# Patient Record
Sex: Male | Born: 1985 | Race: White | Hispanic: No | Marital: Single | Smoking: Current some day smoker
Health system: Southern US, Community
[De-identification: ages and names within clinical notes are randomized; demographics above are authoritative.]

## PROBLEM LIST (undated history)

## (undated) DIAGNOSIS — M109 Gout, unspecified: Secondary | ICD-10-CM

## (undated) DIAGNOSIS — F4325 Adjustment disorder with mixed disturbance of emotions and conduct: Secondary | ICD-10-CM

## (undated) DIAGNOSIS — Z7901 Long term (current) use of anticoagulants: Secondary | ICD-10-CM

## (undated) DIAGNOSIS — I10 Essential (primary) hypertension: Secondary | ICD-10-CM

## (undated) DIAGNOSIS — M1990 Unspecified osteoarthritis, unspecified site: Secondary | ICD-10-CM

## (undated) DIAGNOSIS — I82409 Acute embolism and thrombosis of unspecified deep veins of unspecified lower extremity: Secondary | ICD-10-CM

## (undated) DIAGNOSIS — I272 Pulmonary hypertension, unspecified: Secondary | ICD-10-CM

## (undated) HISTORY — DX: Essential (primary) hypertension: I10

## (undated) HISTORY — DX: Long term (current) use of anticoagulants: Z79.01

## (undated) HISTORY — DX: Adjustment disorder with mixed disturbance of emotions and conduct: F43.25

## (undated) HISTORY — DX: Gout, unspecified: M10.9

## (undated) HISTORY — PX: OTHER SURGICAL HISTORY: SHX169

---

## 2006-06-15 ENCOUNTER — Emergency Department: Payer: Self-pay | Admitting: Emergency Medicine

## 2014-04-25 DIAGNOSIS — Z86718 Personal history of other venous thrombosis and embolism: Secondary | ICD-10-CM | POA: Insufficient documentation

## 2014-06-30 ENCOUNTER — Ambulatory Visit: Payer: Self-pay | Admitting: Sports Medicine

## 2014-08-18 ENCOUNTER — Ambulatory Visit (INDEPENDENT_AMBULATORY_CARE_PROVIDER_SITE_OTHER): Payer: BLUE CROSS/BLUE SHIELD | Admitting: Sports Medicine

## 2014-08-18 ENCOUNTER — Encounter: Payer: Self-pay | Admitting: Sports Medicine

## 2014-08-18 VITALS — BP 144/92 | HR 88 | Ht 70.0 in | Wt 250.0 lb

## 2014-08-18 DIAGNOSIS — R269 Unspecified abnormalities of gait and mobility: Secondary | ICD-10-CM

## 2014-08-18 DIAGNOSIS — L84 Corns and callosities: Secondary | ICD-10-CM | POA: Diagnosis not present

## 2014-08-18 DIAGNOSIS — M2010 Hallux valgus (acquired), unspecified foot: Secondary | ICD-10-CM | POA: Diagnosis not present

## 2014-08-18 DIAGNOSIS — I2699 Other pulmonary embolism without acute cor pulmonale: Secondary | ICD-10-CM | POA: Insufficient documentation

## 2014-08-18 DIAGNOSIS — Q6689 Other  specified congenital deformities of feet: Secondary | ICD-10-CM | POA: Diagnosis not present

## 2014-08-18 NOTE — Progress Notes (Signed)
Jobin UkraineSantiago - 29 y.o. male MRN [id number]  Date of birth: March 12, 1986  SUBJECTIVE: CC: Bilateral foot pain HPI: 29 year old male presenting with long-standing bilateral foot pain worse over the balls of his feet. This is progressively worsened over the past several months. 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 Standing and walking long periods is more bothersome over the balls of his feet. Denies any numbness, tingling ankle weakness. He has tried tramadol without significant improvement. He has been seen by foot and ankle specialist as recommended surgical corrections however he would like to defer this at this time. He has not tried any custom orthotics. He has been using fatigue reducing shoes with rubber insoles. These do seem to help him slightly. Denies any significant back pain.  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  HISTORY:  Past Medical, Surgical, Social, and Family History reviewed & updated per EMR.  Pertinent Historical Findings include:  reports that he has been smoking.  He does not have any smokeless tobacco history on file. PE, family history of foot disorder.   OBJECTIVE:  VS:   HT:5\' 10"  (177.8 cm)   WT:250 lb (113.399 kg)  BMI:35.9          BP:(!) 144/92 mmHg  HR:88bpm  TEMP: ( )  RESP:   PHYSICAL EXAM:  Adult well-built male in no acute distress. Alert and appropriately interactive. Good insight. Lower extremities overall normal alignment. No significant pretibial edema. DP and PT pulses 2+/4. Bilateral Foot & Ankle Exam: Appearance: Forefoot alignment: metatarsus adductus with significant bunion formation, right worse than left. He has second and fourth subluxation of the MTP on the right. Hindfoot alignment:  neutral Longitudinal Arch: moderate Transverse Arch: collapses with weight bearing  Skin: No overlying erythema/ecchymosis. Marked and painful Morton's callus on left foot; diffuse callus on right foot  Palpation: TTP over: Left Morton's callus, metatarsal heads  No TTP over: PT, navicular, midfoot Metatarsal Squeeze Test: Negative   Strength, ROM & OtherTests: Ankle Dorsiflexion: Right = 100; Left = 100 Great toe motion: Right = hallux rigidus; Left = hallux rigidus Repeated Heel Raise: Normal, good posterior tibialis recruitment. 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    ASSESSMENT: 1. Abnormal gait   2. Plantar callus   3. Congenitally short metatarsal   4. Bunion, unspecified laterality    No problems updated.  PROCEDURES: CUSTOM ORTHOTICS The patient was fitted for a standard, cushioned, semi-rigid orthotic. The orthotic was heated & placed on the orthotic stand. The patient was positioned in subtalar neutral position and 10 of ankle dorsiflexion and weight bearing stance some heated orthotic blank. After completion of the molding a stable paste was applied to the orthotic blank. The orthotic was ground to a stable position for weightbearing. The patient ambulated in these and reported they were comfortable without pressure spots.              BLANK:  Size 13 - Standard Cushioned                 BASE:  Blue EVA      POSTINGS:  Small metatarsal pads >50% of this 45 minute visit was spent  in direct face to face evaluation, measurement and manufacture of custom molded orthotic.   PEARING OF CALLUS After informed consent was obtained. Using an 11 blade scalpel the callus on the right foot was pared down until no longer painful. Patient reported marked improvement in his symptoms following this. Discouraged him from performing this at home. Recommended below self treatment.  PLAN: See problem based  charting & AVS for additional documentation.  Custom orthotics today, to be worn on a daily basis.. Follow  Recommend using pumice stone and Carmol, lanolin ointments to soft and calluses. > Return if symptoms worsen or fail to improve.

## 2018-07-21 ENCOUNTER — Ambulatory Visit (HOSPITAL_BASED_OUTPATIENT_CLINIC_OR_DEPARTMENT_OTHER)
Admit: 2018-07-21 | Discharge: 2018-07-21 | Disposition: A | Discharge status: Home / Self Care | Payer: Self-pay | Attending: Emergency Medicine | Admitting: Emergency Medicine

## 2018-07-21 ENCOUNTER — Other Ambulatory Visit: Payer: Self-pay

## 2018-07-21 ENCOUNTER — Emergency Department (HOSPITAL_COMMUNITY): Payer: Self-pay

## 2018-07-21 ENCOUNTER — Observation Stay (HOSPITAL_COMMUNITY)
Admission: EM | Admit: 2018-07-21 | Discharge: 2018-07-22 | Disposition: A | Discharge status: Home / Self Care | Payer: Self-pay | Attending: Internal Medicine | Admitting: Internal Medicine

## 2018-07-21 ENCOUNTER — Encounter (HOSPITAL_COMMUNITY): Payer: Self-pay | Admitting: Emergency Medicine

## 2018-07-21 DIAGNOSIS — N200 Calculus of kidney: Secondary | ICD-10-CM | POA: Insufficient documentation

## 2018-07-21 DIAGNOSIS — I2699 Other pulmonary embolism without acute cor pulmonale: Secondary | ICD-10-CM | POA: Diagnosis present

## 2018-07-21 DIAGNOSIS — R918 Other nonspecific abnormal finding of lung field: Secondary | ICD-10-CM | POA: Insufficient documentation

## 2018-07-21 DIAGNOSIS — I82409 Acute embolism and thrombosis of unspecified deep veins of unspecified lower extremity: Secondary | ICD-10-CM | POA: Diagnosis present

## 2018-07-21 DIAGNOSIS — I10 Essential (primary) hypertension: Secondary | ICD-10-CM | POA: Diagnosis present

## 2018-07-21 DIAGNOSIS — I82402 Acute embolism and thrombosis of unspecified deep veins of left lower extremity: Secondary | ICD-10-CM | POA: Insufficient documentation

## 2018-07-21 DIAGNOSIS — M1990 Unspecified osteoarthritis, unspecified site: Secondary | ICD-10-CM | POA: Insufficient documentation

## 2018-07-21 DIAGNOSIS — F17210 Nicotine dependence, cigarettes, uncomplicated: Secondary | ICD-10-CM | POA: Insufficient documentation

## 2018-07-21 DIAGNOSIS — Z7901 Long term (current) use of anticoagulants: Secondary | ICD-10-CM | POA: Insufficient documentation

## 2018-07-21 DIAGNOSIS — N179 Acute kidney failure, unspecified: Secondary | ICD-10-CM | POA: Diagnosis present

## 2018-07-21 DIAGNOSIS — I272 Pulmonary hypertension, unspecified: Secondary | ICD-10-CM | POA: Insufficient documentation

## 2018-07-21 DIAGNOSIS — Z720 Tobacco use: Secondary | ICD-10-CM | POA: Diagnosis present

## 2018-07-21 DIAGNOSIS — M79605 Pain in left leg: Secondary | ICD-10-CM

## 2018-07-21 DIAGNOSIS — I2694 Multiple subsegmental pulmonary emboli without acute cor pulmonale: Principal | ICD-10-CM | POA: Insufficient documentation

## 2018-07-21 HISTORY — DX: Unspecified osteoarthritis, unspecified site: M19.90

## 2018-07-21 HISTORY — DX: Pulmonary hypertension, unspecified: I27.20

## 2018-07-21 HISTORY — DX: Acute embolism and thrombosis of unspecified deep veins of unspecified lower extremity: I82.409

## 2018-07-21 LAB — BASIC METABOLIC PANEL
Anion gap: 9 (ref 5–15)
BUN: 13 mg/dL (ref 6–20)
CO2: 26 mmol/L (ref 22–32)
Calcium: 9.6 mg/dL (ref 8.9–10.3)
Chloride: 104 mmol/L (ref 98–111)
Creatinine, Ser: 1.37 mg/dL — ABNORMAL HIGH (ref 0.61–1.24)
GFR calc Af Amer: >60 mL/min (ref >60)
GFR calc non Af Amer: >60 mL/min (ref >60)
GLUCOSE: 85 mg/dL (ref 70–99)
Potassium: 4 mmol/L (ref 3.5–5.1)
Sodium: 139 mmol/L (ref 135–145)

## 2018-07-21 LAB — CBC
HCT: 45.2 % (ref 39.0–52.0)
Hemoglobin: 15.4 g/dL (ref 13.0–17.0)
MCH: 31.5 pg (ref 26.0–34.0)
MCHC: 34.1 g/dL (ref 30.0–36.0)
MCV: 92.4 fL (ref 80.0–100.0)
Platelets: 236 10*3/uL (ref 150–400)
RBC: 4.89 MIL/uL (ref 4.22–5.81)
RDW: 12 % (ref 11.5–15.5)
WBC: 10.9 10*3/uL — ABNORMAL HIGH (ref 4.0–10.5)
nRBC: 0 % (ref 0.0–0.2)

## 2018-07-21 LAB — I-STAT TROPONIN, ED: Troponin i, poc: 0.01 ng/mL (ref 0.00–0.08)

## 2018-07-21 LAB — D-DIMER, QUANTITATIVE: D-Dimer, Quant: 4.8 ug/mL-FEU — ABNORMAL HIGH (ref 0.00–0.50)

## 2018-07-21 LAB — PROTIME-INR
INR: 1.07
Prothrombin Time: 13.8 seconds (ref 11.4–15.2)

## 2018-07-21 LAB — BRAIN NATRIURETIC PEPTIDE: B NATRIURETIC PEPTIDE 5: 5.2 pg/mL (ref 0.0–100.0)

## 2018-07-21 LAB — ANTITHROMBIN III: AntiThromb III Func: 86 % (ref 75–120)

## 2018-07-21 MED ORDER — SENNOSIDES-DOCUSATE SODIUM 8.6-50 MG PO TABS: 1.0000 | ORAL_TABLET | Freq: Every evening | ORAL | Status: DC | PRN

## 2018-07-21 MED ORDER — ONDANSETRON HCL 4 MG/2ML IJ SOLN: 4.0000 mg | Freq: Four times a day (QID) | INTRAMUSCULAR | Status: DC | PRN

## 2018-07-21 MED ORDER — ACETAMINOPHEN 650 MG RE SUPP: 650.0000 mg | Freq: Four times a day (QID) | RECTAL | Status: DC | PRN

## 2018-07-21 MED ORDER — ACETAMINOPHEN 325 MG PO TABS: 650.0000 mg | ORAL_TABLET | Freq: Four times a day (QID) | ORAL | Status: DC | PRN

## 2018-07-21 MED ORDER — SODIUM CHLORIDE 0.9 % IV BOLUS
1000.0000 mL | Freq: Once | INTRAVENOUS | Status: AC
  Administered 2018-07-21: 1000 mL via INTRAVENOUS

## 2018-07-21 MED ORDER — OXYCODONE-ACETAMINOPHEN 5-325 MG PO TABS: 1.0000 | ORAL_TABLET | ORAL | Status: DC | PRN

## 2018-07-21 MED ORDER — MORPHINE SULFATE (PF) 2 MG/ML IV SOLN: 2.0000 mg | INTRAVENOUS | Status: DC | PRN

## 2018-07-21 MED ORDER — IOPAMIDOL (ISOVUE-370) INJECTION 76%
INTRAVENOUS | Status: AC
  Administered 2018-07-21: 100 mL
  Filled 2018-07-21: qty 100

## 2018-07-21 MED ORDER — ZOLPIDEM TARTRATE 5 MG PO TABS: 5.0000 mg | ORAL_TABLET | Freq: Every evening | ORAL | Status: DC | PRN

## 2018-07-21 MED ORDER — SODIUM CHLORIDE 0.9 % IV SOLN
INTRAVENOUS | Status: DC
  Administered 2018-07-22 (×2): via INTRAVENOUS

## 2018-07-21 MED ORDER — NICOTINE 21 MG/24HR TD PT24: 21.0000 mg | MEDICATED_PATCH | Freq: Every day | TRANSDERMAL | Status: DC

## 2018-07-21 MED ORDER — HEPARIN BOLUS VIA INFUSION
5000.0000 [IU] | Freq: Once | INTRAVENOUS | Status: AC
  Administered 2018-07-21: 5000 [IU] via INTRAVENOUS
  Filled 2018-07-21: qty 5000

## 2018-07-21 MED ORDER — HEPARIN (PORCINE) 25000 UT/250ML-% IV SOLN
1550.0000 [IU]/h | INTRAVENOUS | Status: DC
  Administered 2018-07-21 – 2018-07-22 (×2): 1550 [IU]/h via INTRAVENOUS
  Filled 2018-07-21 (×2): qty 250

## 2018-07-21 MED ORDER — HYDRALAZINE HCL 20 MG/ML IJ SOLN: 5.0000 mg | INTRAMUSCULAR | Status: DC | PRN

## 2018-07-21 MED ORDER — ALBUTEROL SULFATE (2.5 MG/3ML) 0.083% IN NEBU: 2.5000 mg | INHALATION_SOLUTION | RESPIRATORY_TRACT | Status: DC | PRN

## 2018-07-21 MED ORDER — ONDANSETRON HCL 4 MG PO TABS: 4.0000 mg | ORAL_TABLET | Freq: Four times a day (QID) | ORAL | Status: DC | PRN

## 2018-07-21 MED ORDER — DM-GUAIFENESIN ER 30-600 MG PO TB12
1.0000 | ORAL_TABLET | Freq: Two times a day (BID) | ORAL | Status: DC | PRN
  Filled 2018-07-21: qty 1

## 2018-07-21 NOTE — Progress Notes (Signed)
Left lower extremity venous duplex has been completed. There is evidence of acute deep vein thrombosis involving the popliteal, and intramuscular gastrocnemius veins of the left lower extremity. Results were given to [facility] PA.  07/21/18 6:12 PM Olen Cordial RVT

## 2018-07-21 NOTE — ED Notes (Addendum)
Verified Heparin with Hannie RN

## 2018-07-21 NOTE — ED Provider Notes (Signed)
[facility] EMERGENCY DEPARTMENT Provider Note   CSN: [id number] Arrival date & time: 07/21/18  1619     History   Chief Complaint Chief Complaint  Patient presents with  . Chest Pain  . Leg Pain  . Shortness of Breath    HPI Randall Beasley is a 33 y.o. male.  HPI Randall Beasley is a 33 y.o. male with history of blood clots, presents to emergency department with complaint of left leg pain and chest pain.  Patient states that few days ago he is noticed pain in his left foot, left calf, and now left thigh.  He states that since yesterday the pain has moved into his left lung.  Pain is worsened with deep breathing.  He also reports cough that he has had for several days.  He denies any fever or chills.  No other URI symptoms.  He states his last known blood clot was in 2013, which time he was on blood thinners.  He states he currently does not have a primary care doctor and that is why he is not taking blood thinners.  He denies any recent surgeries.  He is a smoker.  He states that he did travel by car for 2-1/2 hours and back 1 week ago.  Past Medical History:  Diagnosis Date  . Arthritis   . DVT (deep venous thrombosis) (HCC)   . Hypertension   . Pulmonary HTN [facility])     Patient Active Problem List   Diagnosis Date Noted  . PE (pulmonary embolism) 08/18/2014  . H/O thrombosis 04/25/2014    History reviewed. No pertinent surgical history.      Home Medications    Prior to Admission medications   Medication Sig Start Date End Date Taking? Authorizing Provider  rivaroxaban (XARELTO) 20 MG TABS tablet Take 20 mg by mouth.    [provider]    Family History History reviewed. No pertinent family history.  Social History Social History   Tobacco Use  . Smoking status: Current Some Day Smoker    Packs/day: 1.00    Types: Cigarettes  . Smokeless tobacco: Former Engineer, water Use Topics  . Alcohol use: Not Currently    Alcohol/week:  0.0 standard drinks  . Drug use: Never     Allergies   Patient has no known allergies.   Review of Systems Review of Systems  Constitutional: Negative for chills and fever.  Respiratory: Positive for cough, chest tightness and shortness of breath.   Cardiovascular: Positive for chest pain.  Musculoskeletal: Positive for arthralgias and myalgias.  Skin: Negative for color change.  Neurological: Negative for weakness and numbness.  All other systems reviewed and are negative.    Physical Exam Updated Vital Signs BP (!) 142/91   Pulse 85   Resp (!) 21   SpO2 97%   Physical Exam Vitals signs and nursing note reviewed.  Constitutional:      General: He is not in acute distress.    Appearance: He is well-developed.  HENT:     Head: Normocephalic and atraumatic.  Eyes:     Conjunctiva/sclera: Conjunctivae normal.  Neck:     Musculoskeletal: Neck supple.  Cardiovascular:     Rate and Rhythm: Normal rate and regular rhythm.     Heart sounds: Normal heart sounds.  Pulmonary:     Effort: Pulmonary effort is normal. No respiratory distress.     Breath sounds: No wheezing or rales.  Abdominal:     General:  Bowel sounds are normal. There is no distension.     Palpations: Abdomen is soft.     Tenderness: There is no abdominal tenderness. There is no rebound.  Musculoskeletal:     Comments: Legs are equal in size.  Tenderness to palpation over left calf muscle.  Negative Homans sign.  Skin:    General: Skin is warm and dry.  Neurological:     Mental Status: He is alert.      ED Treatments / Results  Labs (all labs ordered are listed, but only abnormal results are displayed) Labs Reviewed  BASIC METABOLIC PANEL - Abnormal; Notable for the following components:      Result Value   Creatinine, Ser 1.37 (*)    All other components within normal limits  CBC - Abnormal; Notable for the following components:   WBC 10.9 (*)    All other components within normal limits    D-DIMER, QUANTITATIVE (NOT AT [facility]) - Abnormal; Notable for the following components:   D-Dimer, Quant 4.80 (*)    All other components within normal limits  PROTIME-INR  I-STAT TROPONIN, ED    EKG None  Radiology Dg Chest 2 View  Result Date: 07/21/2018 CLINICAL DATA:  33 y/o M; central chest pain, shortness of breath, left leg pain. History of DVT and PE. EXAM: CHEST - 2 VIEW COMPARISON:  07/24/2017 chest radiograph. FINDINGS: Stable heart size and mediastinal contours are within normal limits. Both lungs are clear. The visualized skeletal structures are unremarkable. IMPRESSION: No acute pulmonary process identified Electronically Signed   By: Mitzi Hansen M.D.   On: 07/21/2018 17:23    Procedures Procedures (including critical care time)  CRITICAL CARE Performed by: Kimimila Tauzin Total critical care time: 30 minutes Critical care time was exclusive of separately billable procedures and treating other patients. Critical care was necessary to treat or prevent imminent or life-threatening deterioration. Critical care was time spent personally by me on the following activities: development of treatment plan with patient and/or surrogate as well as nursing, discussions with consultants, evaluation of patient's response to treatment, examination of patient, obtaining history from patient or surrogate, ordering and performing treatments and interventions, ordering and review of laboratory studies, ordering and review of radiographic studies, pulse oximetry and re-evaluation of patient's condition.  Medications Ordered in ED Medications - No data to display   Initial Impression / Assessment and Plan / ED Course  I have reviewed the triage vital signs and the nursing notes.  Pertinent labs & imaging results that were available during my care of the patient were reviewed by me and considered in my medical decision making (see chart for details).     Patient with  history of DVT and PE, last in 2013, here with increased left leg pain and now pleuritic chest pain and shortness of breath.  History highly concerning for PE.  D-dimer obtained in triage and is elevated at 4.8.  I will get CT Angio of his chest and ultrasound of his left lower leg.  Hemodynamically stable at this time.  US showing positive DVT. Will start heparin. Pt continues to be hemodynamically stable.   8:45 PM CT angio positive for subsegmental PEs and possible bilateral lower lobe infarcts. Will admit  Spoke with medicine, will admit pt.   Vitals:   07/21/18 1745 07/21/18 1800 07/21/18 1846 07/21/18 1915  BP: (!) 142/91  140/83 133/87  Pulse: 85  85 89  Resp: (!) 21  20 (!) 22  SpO2: 97%  98% 97%  Weight:  108.9 kg    Height:  5\' 10"  (1.778 m)           Final Clinical Impressions(s) / ED Diagnoses   Final diagnoses:  Multiple subsegmental pulmonary emboli without acute cor pulmonale  Acute deep vein thrombosis (DVT) of left lower extremity, unspecified vein [facility](HCC)    ED Discharge Orders    None       Jaynie CrumbleKirichenko, Irma Roulhac, PA-C 07/21/18 2143    Little, Ambrose Finlandachel Morgan, MD 07/21/18 2307

## 2018-07-21 NOTE — H&P (Signed)
History and Physical    Randall Beasley ZOX:[id number] DOB: 1985/12/09 DOA: 07/21/2018  Referring MD/NP/PA:   PCP: Vivien Prestoorrington, Randall A, MD   Patient coming from:  The patient is coming from home.  At baseline, pt is independent for most of ADL.        Chief Complaint: Chest pain, shortness of breath, left lower leg pain  HPI: Randall Beasley is a 33 y.o. male with medical history significant of hypertension, DVT, PE, tobacco abuse, who presents with chest pain, shortness breath, left lower leg pain.  Patient states that he was diagnosed with DVT and PE 2010.  He has been taking Coumadin until 1.4738-month ago.  Patient states that because of financial difficulties, he could not get Coumadin refilled.  He developed chest pain and shortness of breath, left leg pain in the past several days.  The chest pain is located left side of chest, 8 out of 10 severity, nonradiating, sharp, pleuritic, aggravated by deep breath and coughing.  He has mild dry cough, but no fever or chills.  He also has moderate left lower leg pain, particularly in the calf areas.  Patient had 2.5 hours traveling recently.  Denies nausea, vomiting, diarrhea, abdominal pain, symptoms of UTI or unilateral weakness.  ED Course: pt was found to have WBC 10.9, INR 1.07, AKI with creatinine 1.37, BUN 13, temperature normal, heart rate 80s, tachypnea, oxygen saturation 97% on room air, negative chest x-ray.  CTA of chest showed: 1. Subsegmental pulmonary emboli in the left upper and lower lobes. 2. Focal opacities in the right lower lobe and left upper lobe, nonspecific but could reflect atelectasis or infarcts.  LE doppler showed: 1. Right: No evidence of common femoral vein obstruction. 2. Left: Findings consistent with acute deep vein thrombosis involving the left popliteal vein, and left gastrocnemius vein. No cystic structure found in the popliteal fossa.   Review of Systems:   General: no fevers, chills, no body weight gain, has  poor appetite, has fatigue HEENT: no blurry vision, hearing changes or sore throat Respiratory: has dyspnea, coughing, no wheezing CV: no chest pain, no palpitations GI: no nausea, vomiting, abdominal pain, diarrhea, constipation GU: no dysuria, burning on urination, increased urinary frequency, hematuria  Ext: no leg edema Neuro: no unilateral weakness, numbness, or tingling, no vision change or hearing loss Skin: no rash, no skin tear. MSK: No muscle spasm, no deformity, no limitation of range of movement in spin. Has left lower leg calf pain. Heme: No easy bruising.  Travel history: No recent long distant travel.  Allergy:  Allergies  Allergen Reactions  . Bee Venom Swelling    Swells at site of sting, no breathing impairment    Past Medical History:  Diagnosis Date  . Arthritis   . DVT (deep venous thrombosis) (HCC)   . Hypertension   . Pulmonary HTN (HCC)     Past Surgical History:  Procedure Laterality Date  . dental procedure      Social History:  reports that he has been smoking cigarettes. He has been smoking about 1.00 pack per day. He has quit using smokeless tobacco. He reports previous alcohol use. He reports that he does not use drugs.  Family History:  Family History  Problem Relation Age of Onset  . Diabetes Mellitus II Mother      Prior to Admission medications   Medication Sig Start Date End Date Taking? Authorizing Provider  rivaroxaban (XARELTO) 20 MG TABS tablet Take 20 mg by mouth.  [provider]    Physical Exam: Vitals:   07/22/18 0000 07/22/18 0024 07/22/18 0048 07/22/18 0050  BP:  (!) 143/72 (!) 144/92 (!) 144/92  Pulse: 78  81 81  Resp: 19  17 17   Temp:    97.8 F (36.6 C)  TempSrc:   Oral Oral  SpO2: 95%  97%   Weight:    108.9 kg  Height:    5\' 10"  (1.778 m)   General: Not in acute distress HEENT:       Eyes: PERRL, EOMI, no scleral icterus.       ENT: No discharge from the ears and nose, no pharynx injection, no  tonsillar enlargement.        Neck: No JVD, no bruit, no mass felt. Heme: No neck lymph node enlargement. Cardiac: S1/S2, RRR, No murmurs, No gallops or rubs. Respiratory: No rales, wheezing, rhonchi or rubs. GI: Soft, nondistended, nontender, no rebound pain, no organomegaly, BS present. GU: No hematuria Ext: No pitting leg edema bilaterally. 2+DP/PT pulse bilaterally. Musculoskeletal: No joint deformities, No joint redness or warmth, no limitation of ROM in spin. Has left lower leg calf tenderness.  Skin: No rashes.  Neuro: Alert, oriented X3, cranial nerves II-XII grossly intact, moves all extremities normally. Psych: Patient is not psychotic, no suicidal or hemocidal ideation.  Labs on Admission: I have personally reviewed following labs and imaging studies  CBC: Recent Labs  Lab 07/21/18 1644 07/22/18 0141  WBC 10.9* 9.0  HGB 15.4 14.4  HCT 45.2 41.5  MCV 92.4 90.2  PLT 236 207   Basic Metabolic Panel: Recent Labs  Lab 07/21/18 1644 07/22/18 0141  NA 139 138  K 4.0 3.4*  CL 104 107  CO2 26 23  GLUCOSE 85 104*  BUN 13 12  CREATININE 1.37* 1.27*  CALCIUM 9.6 8.5*   GFR: Estimated Creatinine Clearance: 103.2 mL/min (A) (by C-G formula based on SCr of 1.27 mg/dL (H)). Liver Function Tests: No results for input(s): AST, ALT, ALKPHOS, BILITOT, PROT, ALBUMIN in the last 168 hours. No results for input(s): LIPASE, AMYLASE in the last 168 hours. No results for input(s): AMMONIA in the last 168 hours. Coagulation Profile: Recent Labs  Lab 07/21/18 1644  INR 1.07   Cardiac Enzymes: No results for input(s): CKTOTAL, CKMB, CKMBINDEX, TROPONINI in the last 168 hours. BNP (last 3 results) No results for input(s): PROBNP in the last 8760 hours. HbA1C: No results for input(s): HGBA1C in the last 72 hours. CBG: No results for input(s): GLUCAP in the last 168 hours. Lipid Profile: No results for input(s): CHOL, HDL, LDLCALC, TRIG, CHOLHDL, LDLDIRECT in the last 72  hours. Thyroid Function Tests: No results for input(s): TSH, T4TOTAL, FREET4, T3FREE, THYROIDAB in the last 72 hours. Anemia Panel: No results for input(s): VITAMINB12, FOLATE, FERRITIN, TIBC, IRON, RETICCTPCT in the last 72 hours. Urine analysis: No results found for: COLORURINE, APPEARANCEUR, LABSPEC, PHURINE, GLUCOSEU, HGBUR, BILIRUBINUR, KETONESUR, PROTEINUR, UROBILINOGEN, NITRITE, LEUKOCYTESUR Sepsis Labs: @LABRCNTIP (procalcitonin:4,lacticidven:4) )No results found for this or any previous visit (from the past 240 hour(s)).   Radiological Exams on Admission: Dg Chest 2 View  Result Date: 07/21/2018 CLINICAL DATA:  33 y/o M; central chest pain, shortness of breath, left leg pain. History of DVT and PE. EXAM: CHEST - 2 VIEW COMPARISON:  07/24/2017 chest radiograph. FINDINGS: Stable heart size and mediastinal contours are within normal limits. Both lungs are clear. The visualized skeletal structures are unremarkable. IMPRESSION: No acute pulmonary process identified Electronically Signed   By:  Mitzi Hansen M.D.   On: 07/21/2018 17:23   Ct Angio Chest Pe W And/or Wo Contrast  Result Date: 07/21/2018 CLINICAL DATA:  Chest pain with shortness of breath. Lower extremity DVT diagnosed today. History of PE. EXAM: CT ANGIOGRAPHY CHEST WITH CONTRAST TECHNIQUE: Multidetector CT imaging of the chest was performed using the standard protocol during bolus administration of intravenous contrast. Multiplanar CT image reconstructions and MIPs were obtained to evaluate the vascular anatomy. CONTRAST:  ISOVUE-370 IOPAMIDOL (ISOVUE-370) INJECTION 76% COMPARISON:  08/07/2017 FINDINGS: Cardiovascular: Pulmonary arterial opacification is adequate without definite segmental or more proximal pulmonary emboli. There is a subsegmental embolus involving the anterior left upper lobe (series 7, image 126). Subsegmental emboli are also suspected in the left lower lobe with assessment limited by motion  artifact (for example series 7, image 223). The heart is borderline enlarged. There is no pericardial effusion. The thoracic aorta is normal in caliber. Mediastinum/Nodes: No enlarged axillary, mediastinal, or hilar lymph nodes. Grossly unremarkable esophagus and included portion of the thyroid. Lungs/Pleura: No pleural effusion or pneumothorax. 1.9 cm focus of mixed ground-glass and consolidative opacity in the basilar right lower lobe abutting the major fissure (series 6, image 80). Similar opacity in the anteromedial left upper lobe (series 6, image 57). Upper Abdomen: Partially visualized left upper pole renal calculus, also present previously. Musculoskeletal: No acute osseous abnormality or suspicious osseous lesion. Review of the MIP images confirms the above findings. IMPRESSION: 1. Subsegmental pulmonary emboli in the left upper and lower lobes. 2. Focal opacities in the right lower lobe and left upper lobe, nonspecific but could reflect atelectasis or infarcts. Critical Value/emergent results were called by telephone at the time of interpretation on 07/21/2018 at 7:49 pm to [facility] , who verbally acknowledged these results. Electronically Signed   By: Sebastian Ache M.D.   On: 07/21/2018 19:50   Vas [place] Lower Extremity Venous (dvt) (only [facility])  Result Date: 07/21/2018  Lower Venous Study Indications: Pain.  Performing Technologist: Chanda Busing RVT  Examination Guidelines: A complete evaluation includes B-mode imaging, spectral Doppler, color Doppler, and power Doppler as needed of all accessible portions of each vessel. Bilateral testing is considered an integral part of a complete examination. Limited examinations for reoccurring indications may be performed as noted.  Right Venous Findings: +---+---------------+---------+-----------+----------+-------+    CompressibilityPhasicitySpontaneityPropertiesSummary +---+---------------+---------+-----------+----------+-------+ CFVFull            Yes      Yes                          +---+---------------+---------+-----------+----------+-------+  Left Venous Findings: +---------+---------------+---------+-----------+----------+-------+          CompressibilityPhasicitySpontaneityPropertiesSummary +---------+---------------+---------+-----------+----------+-------+ CFV      Full           Yes      Yes                          +---------+---------------+---------+-----------+----------+-------+ SFJ      Full                                                 +---------+---------------+---------+-----------+----------+-------+ FV Prox  Full                                                 +---------+---------------+---------+-----------+----------+-------+  FV Mid   Full                                                 +---------+---------------+---------+-----------+----------+-------+ FV DistalFull                                                 +---------+---------------+---------+-----------+----------+-------+ PFV      Full                                                 +---------+---------------+---------+-----------+----------+-------+ POP      Partial        No       No                   Acute   +---------+---------------+---------+-----------+----------+-------+ PTV      Full                                                 +---------+---------------+---------+-----------+----------+-------+ PERO     Full                                                 +---------+---------------+---------+-----------+----------+-------+ Gastroc  Full                                         Acute   +---------+---------------+---------+-----------+----------+-------+    Summary: Right: No evidence of common femoral vein obstruction. Left: Findings consistent with acute deep vein thrombosis involving the left popliteal vein, and left gastrocnemius vein. No cystic structure found in the  popliteal fossa.  *See table(s) above for measurements and observations. Electronically signed by Tonny BollmanMichael Cooper MD on 07/21/2018 at 8:05:12 PM.    Final      EKG: Independently reviewed.  Sinus rhythm, QTC 443, poor R wave progression, nonspecific T wave change.  Assessment/Plan Principal Problem:   Pulmonary embolism (HCC) Active Problems:   DVT (deep venous thrombosis) (HCC)   Tobacco abuse   Hypertension   AKI (acute kidney injury) (HCC)   Pulmonary embolism and DVT: Hemodynamically stable.  Oxygen saturation 97% on room air. -will place on tele bed for obs -heparin drip initiated -2D echocardiogram ordered -Hypercoag panel -pain control: When necessary Percocet and morphine -prn albuterol nebs and mucinex  -Consult case manager for medication need  Tobacco abuse -Did counseling about importance of quitting smoking -Nicotine patch   Hypertension: bp 133/87. Not taking meds at home -prn hydralazine IV  AKI (acute kidney injury) [facility](HCC): Creatinine 1.37, BUN 13.  May be due to dehydration. -IV fluid: 1 L normal saline, followed by 125 cc/h   DVT ppx: on IV Heparin Code Status: Full code Family Communication: None at bed side.    Disposition Plan:  Anticipate discharge back to previous home environment Consults called:  none Admission status: Obs / tele      Date of Service 07/22/2018    Lorretta Harp [facility] Pager [phone]  If 7PM-7AM, please contact night-coverage [email or web address] Password [facility] 07/22/2018, 3:33 AM

## 2018-07-21 NOTE — Progress Notes (Signed)
ANTICOAGULATION CONSULT NOTE - Initial Consult  Pharmacy Consult for heparin Indication: pulmonary embolus  No Known Allergies  Patient Measurements: Height: 5\' 10"  (177.8 cm) Weight: 240 lb (108.9 kg) IBW/kg (Calculated) : 73 Heparin Dosing Weight: 96.5 kg  Vital Signs: BP: 142/91 (01/07 1745) Pulse Rate: 85 (01/07 1745)  Labs: Recent Labs    07/21/18 1644  HGB 15.4  HCT 45.2  PLT 236  LABPROT 13.8  INR 1.07  CREATININE 1.37*    Estimated Creatinine Clearance: 95.7 mL/min (A) (by C-G formula based on SCr of 1.37 mg/dL (H)).   Medical History: Past Medical History:  Diagnosis Date  . Arthritis   . DVT (deep venous thrombosis) (HCC)   . Hypertension   . Pulmonary HTN (HCC)     Medications:  Scheduled:  . heparin  5,000 Units Intravenous Once    Assessment: 32 yom with hx of DVT (on Xarelto in 2016, changed to warfarin due to cost issues - stopped ~1.5 months ago). Presenting with chest pain, SOB, and L leg pain.   Venous duplex showing evidence of acute DVT involving popliteal and intramuscular gastrocnemius veins of LLE. Hgb 15.4, plt 236. D-dimer 4.8. CT chest ordered. No s/sx of bleeding.  Goal of Therapy:  Heparin level 0.3-0.7 units/ml Monitor platelets by anticoagulation protocol: Yes   Plan:  Give 5000 units bolus x 1 Start heparin infusion at 1550 units/hr Check anti-Xa level in 6 hours and daily while on heparin Continue to monitor H&H and platelets  Sherron Monday, PharmD, BCCCP Clinical Pharmacist  Pager: [phone] Phone: [phone] 07/21/2018,6:26 PM

## 2018-07-21 NOTE — ED Triage Notes (Signed)
Pt with hx of DVT, PE c/o chest pain, SOB, and left leg pain. Pt reports that he is supposed to be on coumadin but has not been taking it due to financial reasons and lack of insurance. States pain is worse with deep breaths, movement, and coughing.

## 2018-07-22 ENCOUNTER — Observation Stay (HOSPITAL_BASED_OUTPATIENT_CLINIC_OR_DEPARTMENT_OTHER): Payer: Self-pay

## 2018-07-22 DIAGNOSIS — I2699 Other pulmonary embolism without acute cor pulmonale: Secondary | ICD-10-CM

## 2018-07-22 DIAGNOSIS — I824Y9 Acute embolism and thrombosis of unspecified deep veins of unspecified proximal lower extremity: Secondary | ICD-10-CM

## 2018-07-22 DIAGNOSIS — N179 Acute kidney failure, unspecified: Secondary | ICD-10-CM

## 2018-07-22 LAB — HEPARIN LEVEL (UNFRACTIONATED)
HEPARIN UNFRACTIONATED: 0.4 [IU]/mL (ref 0.30–0.70)
Heparin Unfractionated: 0.5 IU/mL (ref 0.30–0.70)

## 2018-07-22 LAB — CBC
HCT: 41.5 % (ref 39.0–52.0)
Hemoglobin: 14.4 g/dL (ref 13.0–17.0)
MCH: 31.3 pg (ref 26.0–34.0)
MCHC: 34.7 g/dL (ref 30.0–36.0)
MCV: 90.2 fL (ref 80.0–100.0)
NRBC: 0 % (ref 0.0–0.2)
Platelets: 207 10*3/uL (ref 150–400)
RBC: 4.6 MIL/uL (ref 4.22–5.81)
RDW: 12 % (ref 11.5–15.5)
WBC: 9 10*3/uL (ref 4.0–10.5)

## 2018-07-22 LAB — HIV ANTIBODY (ROUTINE TESTING W REFLEX): HIV Screen 4th Generation wRfx: NONREACTIVE

## 2018-07-22 LAB — ECHOCARDIOGRAM COMPLETE
Height: 70 in
Weight: 3840 oz

## 2018-07-22 LAB — BASIC METABOLIC PANEL
Anion gap: 8 (ref 5–15)
BUN: 12 mg/dL (ref 6–20)
CO2: 23 mmol/L (ref 22–32)
Calcium: 8.5 mg/dL — ABNORMAL LOW (ref 8.9–10.3)
Chloride: 107 mmol/L (ref 98–111)
Creatinine, Ser: 1.27 mg/dL — ABNORMAL HIGH (ref 0.61–1.24)
GFR calc Af Amer: >60 mL/min (ref >60)
GFR calc non Af Amer: >60 mL/min (ref >60)
Glucose, Bld: 104 mg/dL — ABNORMAL HIGH (ref 70–99)
POTASSIUM: 3.4 mmol/L — AB (ref 3.5–5.1)
Sodium: 138 mmol/L (ref 135–145)

## 2018-07-22 MED ORDER — WARFARIN SODIUM 10 MG PO TABS: 10.0000 mg | ORAL_TABLET | Freq: Once | ORAL | Status: DC

## 2018-07-22 MED ORDER — WARFARIN - PHARMACIST DOSING INPATIENT: Freq: Every day | Status: DC

## 2018-07-22 MED ORDER — OXYCODONE-ACETAMINOPHEN 5-325 MG PO TABS: 1.0000 | ORAL_TABLET | Freq: Four times a day (QID) | ORAL | 0 refills | Status: AC | PRN

## 2018-07-22 MED ORDER — RIVAROXABAN 15 MG PO TABS
15.0000 mg | ORAL_TABLET | Freq: Two times a day (BID) | ORAL | Status: DC
  Administered 2018-07-22: 15 mg via ORAL
  Filled 2018-07-22: qty 1

## 2018-07-22 MED ORDER — RIVAROXABAN (XARELTO) EDUCATION KIT FOR DVT/PE PATIENTS
PACK | Freq: Once | Status: DC
  Filled 2018-07-22: qty 1

## 2018-07-22 MED ORDER — POTASSIUM CHLORIDE CRYS ER 20 MEQ PO TBCR
40.0000 meq | EXTENDED_RELEASE_TABLET | ORAL | Status: AC
  Administered 2018-07-22: 40 meq via ORAL
  Filled 2018-07-22: qty 2

## 2018-07-22 MED ORDER — RIVAROXABAN (XARELTO) VTE STARTER PACK (15 & 20 MG): ORAL_TABLET | ORAL | 0 refills | Status: DC

## 2018-07-22 MED FILL — XARELTO STARTER PACK: 15 & 20 | 30 days supply | Qty: 51 | Fill #0

## 2018-07-22 NOTE — Progress Notes (Signed)
ANTICOAGULATION CONSULT NOTE   Pharmacy Consult for heparin Indication: pulmonary embolus  Allergies  Allergen Reactions  . Bee Venom Swelling    Swells at site of sting, no breathing impairment    Patient Measurements: Height: 5\' 10"  (177.8 cm) Weight: 240 lb (108.9 kg) IBW/kg (Calculated) : 73 Heparin Dosing Weight: 96.5 kg  Vital Signs: Temp: 97.8 F (36.6 C) (01/08 0050) Temp Source: Oral (01/08 0050) BP: 144/92 (01/08 0050) Pulse Rate: 81 (01/08 0050)  Labs: Recent Labs    07/21/18 1644 07/22/18 0141  HGB 15.4 14.4  HCT 45.2 41.5  PLT 236 207  LABPROT 13.8  --   INR 1.07  --   HEPARINUNFRC  --  0.50  CREATININE 1.37* 1.27*    Estimated Creatinine Clearance: 103.2 mL/min (A) (by C-G formula based on SCr of 1.27 mg/dL (H)).   Medical History: Past Medical History:  Diagnosis Date  . Arthritis   . DVT (deep venous thrombosis) (HCC)   . Hypertension   . Pulmonary HTN (HCC)     Medications:  Scheduled:  . nicotine  21 mg Transdermal Daily    Assessment: 32 yom with hx of DVT (on Xarelto in 2016, changed to warfarin due to cost issues - stopped ~1.5 months ago). Presenting with chest pain, SOB, and L leg pain.   Venous duplex showing evidence of acute DVT involving popliteal and intramuscular gastrocnemius veins of LLE. Hgb 15.4, plt 236. D-dimer 4.8. CT chest ordered. No s/sx of bleeding. Initial heparin level 0.50 units/ml  Goal of Therapy:  Heparin level 0.3-0.7 units/ml Monitor platelets by anticoagulation protocol: Yes   Plan:  Continue heparin at 1550 units/hr Check heparin level later today to confirm  Thanks for allowing pharmacy to be a part of this patient's care.  Talbert Cage, PharmD Clinical Pharmacist

## 2018-07-22 NOTE — Progress Notes (Signed)
  Echocardiogram 2D Echocardiogram has been performed.  Pieter Partridge 07/22/2018, 9:02 AM

## 2018-07-22 NOTE — Discharge Instructions (Addendum)
Deep Vein Thrombosis  Deep vein thrombosis (DVT) is a condition in which a blood clot forms in a deep vein, such as a lower leg, thigh, or arm vein. A clot is blood that has thickened into a gel or solid. This condition is dangerous. It can lead to serious and even life-threatening complications if the clot travels to the lungs and causes a blockage (pulmonary embolism). It can also damage veins in the leg. This can result in leg pain, swelling, discoloration, and sores (post-thrombotic syndrome). What are the causes? This condition may be caused by:  A slowdown of blood flow.  Damage to a vein.  A condition that causes blood to clot more easily, such as an inherited clotting disorder. What increases the risk? The following factors may make you more likely to develop this condition:  Being overweight.  Being older, especially over age 62.  Sitting or lying down for more than four hours.  Being in the hospital.  Lack of physical activity (sedentary lifestyle).  Pregnancy, being in childbirth, or having recently given birth.  Taking medicines that contain estrogen, such as medicines to prevent pregnancy.  Smoking.  A history of any of the following: ? Blood clots or a blood clotting disease. ? Peripheral vascular disease. ? Inflammatory bowel disease. ? Cancer. ? Heart disease. ? Genetic conditions that affect how your blood clots, such as Factor V Leiden mutation. ? Neurological diseases that affect your legs (leg paresis). ? A recent injury, such as a car accident. ? Major or lengthy surgery. ? A central line placed inside a large vein. What are the signs or symptoms? Symptoms of this condition include:  Swelling, pain, or tenderness in an arm or leg.  Warmth, redness, or discoloration in an arm or leg. If the clot is in your leg, symptoms may be more noticeable or worse when you stand or walk. Some people may not develop any symptoms. How is this diagnosed? This  condition is diagnosed with:  A medical history and physical exam.  Tests, such as: ? Blood tests. These are done to check how well your blood clots. ? Ultrasound. This is done to check for clots. ? Venogram. For this test, contrast dye is injected into a vein and X-rays are taken to check for any clots. How is this treated? Treatment for this condition depends on:  The cause of your DVT.  Your risk for bleeding or developing more clots.  Any other medical conditions that you have. Treatment may include:  Taking a blood thinner (anticoagulant). This type of medicine prevents clots from forming. It may be taken by mouth, injected under the skin, or injected through an IV (catheter).  Injecting clot-dissolving medicines into the affected vein (catheter-directed thrombolysis).  Having surgery. Surgery may be done to: ? Remove the clot. ? Place a filter in a large vein to catch blood clots before they reach the lungs. Some treatments may be continued for up to six months. Follow these instructions at home: If you are taking blood thinners:  Take the medicine exactly as told by your health care provider. Some blood thinners need to be taken at the same time every day. Do not skip a dose.  Talk with your health care provider before you take any medicines that contain aspirin or NSAIDs. These medicines increase your risk for dangerous bleeding.  Ask your health care provider about foods and drugs that could change the way the medicine works (may interact). Avoid those things if  your health care provider tells you to do so.  Blood thinners can cause easy bruising and may make it difficult to stop bleeding. Because of this: ? Be very careful when using knives, scissors, or other sharp objects. ? Use an electric razor instead of a blade. ? Avoid activities that could cause injury or bruising, and follow instructions about how to prevent falls.  Wear a medical alert bracelet or carry a  card that lists what medicines you take. General instructions  Take over-the-counter and prescription medicines only as told by your health care provider.  Return to your normal activities as told by your health care provider. Ask your health care provider what activities are safe for you.  Wear compression stockings if recommended by your health care provider.  Keep all follow-up visits as told by your health care provider. This is important. How is this prevented? To lower your risk of developing this condition again:  For 30 or more minutes every day, do an activity that: ? Involves moving your arms and legs. ? Increases your heart rate.  When traveling for longer than four hours: ? Exercise your arms and legs every hour. ? Drink plenty of water. ? Avoid drinking alcohol.  Avoid sitting or lying for a long time without moving your legs.  If you have surgery or you are hospitalized, ask about ways to prevent blood clots. These may include taking frequent walks or using anticoagulants.  Stay at a healthy weight.  If you are a woman who is older than age 71, avoid unnecessary use of medicines that contain estrogen, such as some birth control pills.  Do not use any products that contain nicotine or tobacco, such as cigarettes and e-cigarettes. This is especially important if you take estrogen medicines. If you need help quitting, ask your health care provider. Contact a health care provider if:  You miss a dose of your blood thinner.  Your menstrual period is heavier than usual.  You have unusual bruising. Get help right away if:  You have: ? New or increased pain, swelling, or redness in an arm or leg. ? Numbness or tingling in an arm or leg. ? Shortness of breath. ? Chest pain. ? A rapid or irregular heartbeat. ? A severe headache or confusion. ? A cut that will not stop bleeding.  There is blood in your vomit, stool, or urine.  You have a serious fall or accident,  or you hit your head.  You feel light-headed or dizzy.  You cough up blood. These symptoms may represent a serious problem that is an emergency. Do not wait to see if the symptoms will go away. Get medical help right away. Call your local emergency services (911 in the [place] Do not drive yourself to the hospital. Summary  Deep vein thrombosis (DVT) is a condition in which a blood clot forms in a deep vein, such as a lower leg, thigh, or arm vein.  Symptoms can include swelling, warmth, pain, and redness in your leg or arm.  This condition may be treated with a blood thinner (anticoagulant medicine), medicine that is injected to dissolve blood clots,compression stockings, or surgery.  If you are prescribed blood thinners, take them exactly as told. This information is not intended to replace advice given to you by your health care provider. Make sure you discuss any questions you have with your health care provider. Document Released: 07/01/2005 Document Revised: 11/29/2016 Document Reviewed: 11/29/2016 Elsevier Interactive Patient Education  2019 [employer]vier  Inc[employer]   Bleeding Precautions When on Anticoagulant Therapy, Adult Anticoagulant therapy, also called blood thinner therapy, is medicine that helps to prevent and treat blood clots. The medicine works by stopping blood clots from forming or growing. Blood clots that form in your blood vessels can be dangerous. They can break loose and travel to the heart, lungs, or brain. This increases the risk of a heart attack, stroke, or blocked lung artery (pulmonary embolism). Anticoagulants also increase the risk of bleeding. Try to protect yourself from cuts and other injuries that can cause bleeding. It is important to take anticoagulants exactly as told by your health care provider. Why do I need to be on anticoagulant therapy? You may need this medicine if you are at risk of developing a blood clot. Conditions that increase your risk of a blood  clot include:  Being born with heart disease or a heart malformation (congenital heart disease).  Developing heart disease.  Having had surgery, such as valve replacement.  Having had a serious accident or other type of severe injury (trauma).  Having certain types of cancer.  Having certain diseases that can increase blood clotting.  Having a high risk of stroke or heart attack.  Having atrial fibrillation (AF). What are the common anticoagulant medicines? There are several types of anticoagulant medicines. The most common types are:  Medicines that you take by mouth (oral medicines), such as: ? Warfarin. ? Novel oral anticoagulants (NOACs), such as: ? Direct thrombin inhibitors (dabigatran). ? Factor Xa inhibitors (apixaban, edoxaban, and rivaroxaban).  Injections, such as: ? Unfractionated heparin. ? Low molecular weight heparin. These anticoagulants work in different ways to prevent blood clots. They also have different risks and side effects. What do I need to remember while on anticoagulant therapy? Taking anticoagulants  Take your medicine at the same time every day. If you forget to take your medicine, take it as soon as you remember. Do not double your dosage of medicine if you miss a whole day. Take your normal dose and call your health care provider.  Do not stop taking your medicine unless your health care provider approves. Stopping the medicine can increase your risk of developing a blood clot. Taking other medicines  Take over-the-counter and prescriptions medicines only as told by your health care provider.  Do not take over-the-counter NSAIDs, including aspirin and ibuprofen, while you are on anticoagulant therapy. These medicines increase your risk of dangerous bleeding.  Get approval from your health care provider before you start taking any new medicines, vitamins, or herbal products. Some of these could interfere with your therapy. General  instructions  Keep all follow-up visits as told by your health care provider. This is important.  If you are pregnant or trying to get pregnant, talk with a health care provider about anticoagulants. Some of these medicines are not safe to take during pregnancy.  Tell all health care providers, including your dentist, that you are on anticoagulant therapy. It is especially important to tell providers before you have any surgery, medical procedures, or dental work done. What precautions should I take?   Be very careful when using knives, scissors, or other sharp objects.  Use an electric razor instead of a blade.  Do not use toothpicks.  Use a soft-bristled toothbrush. Brush your teeth gently.  Always wear shoes outdoors and wear slippers indoors.  Be careful when cutting your fingernails and toenails.  Place bath mats in the bathroom. If possible, install handrails as well.  Wear  gloves while you do yard work.  Wear your seat belt.  Prevent falls by removing loose rugs and extension cords from areas where you walk. Use a cane or walker if you need it.  Avoid constipation by: ? Drinking enough fluid to keep your urine clear or pale yellow. ? Eating foods that are high in fiber, such as fresh fruits and vegetables, whole grains, and beans. ? Limiting foods that are high in fat and processed sugars, such as fried and sweet foods.  Do not play contact sports or participate in other activities that have a high risk for injury. What other precautions are important if on warfarin therapy? If you are taking a type of anticoagulant called warfarin, make sure you:  Work with a diet and nutrition specialist (dietitian) to make an eating plan. Do not make any sudden changes to your diet after you have started your eating plan.  Do not drink alcohol. It can interfere with your medicine and increase your risk of an injury that causes bleeding.  Get regular blood tests as told by your  health care provider. What are some questions to ask my health care provider?  Why do I need anticoagulant therapy?  What is the best anticoagulant therapy for my condition?  How long will I need anticoagulant therapy?  What are the side effects of anticoagulant therapy?  When should I take my medicine? What should I do if I forget to take it?  Will I need to have regular blood tests?  Do I need to change my diet? Are there foods or drinks that I should avoid?  What activities are safe for me?  What should I do if I want to get pregnant? Contact a health care provider if:  You miss a dose of medicine: ? And you are not sure what to do. ? For more than one day.  You have: ? Menstrual bleeding that is heavier than normal. ? Bloody or brown urine. ? Easy bruising. ? Black and tarry stool or bright red stool. ? Side effects from your medicine.  You feel weak or dizzy.  You become pregnant. Get help right away if:  You have bleeding that will not stop within 20 minutes from: ? The nose. ? The gums. ? A cut on the skin.  You have a severe headache or stomachache.  You vomit or cough up blood.  You fall or hit your head. Summary  Anticoagulant therapy, also called blood thinner therapy, is medicine that helps to prevent and treat blood clots.  Anticoagulants work in different ways to prevent blood clots. They also have different risks and side effects.  Talk with your health care provider about any precautions that you should take while on anticoagulant therapy. This information is not intended to replace advice given to you by your health care provider. Make sure you discuss any questions you have with your health care provider. Document Released: 06/12/2015 Document Revised: 09/17/2016 Document Reviewed: 09/17/2016 Elsevier Interactive Patient Education  2019 [employer] Please keep follow-up appointments as listed. Your new primary doctor should check blood  work of a CBC and a BMP at the appointment to make sure that you are blood counts and kidney function are stable. Please keep yourself adequately hydrated.  Information on my medicine - XARELTO (rivaroxaban)  This medication education was reviewed with me or my healthcare representative as part of my discharge preparation.    WHY WAS XARELTO PRESCRIBED FOR YOU? Xarelto was prescribed to  treat blood clots that may have been found in the veins of your legs (deep vein thrombosis) or in your lungs (pulmonary embolism) and to reduce the risk of them occurring again.  What do you need to know about Xarelto? The starting dose is one 15 mg tablet taken TWICE daily with food for the FIRST 21 DAYS then on (enter date)  08/12/2018  the dose is changed to one 20 mg tablet taken ONCE A DAY with your evening meal.  DO NOT stop taking Xarelto without talking to the health care provider who prescribed the medication.  Refill your prescription for 20 mg tablets before you run out.  After discharge, you should have regular check-up appointments with your healthcare provider that is prescribing your Xarelto.  In the future your dose may need to be changed if your kidney function changes by a significant amount.  What do you do if you miss a dose? If you are taking Xarelto TWICE DAILY and you miss a dose, take it as soon as you remember. You may take two 15 mg tablets (total 30 mg) at the same time then resume your regularly scheduled 15 mg twice daily the next day.  If you are taking Xarelto ONCE DAILY and you miss a dose, take it as soon as you remember on the same day then continue your regularly scheduled once daily regimen the next day. Do not take two doses of Xarelto at the same time.   Important Safety Information Xarelto is a blood thinner medicine that can cause bleeding. You should call your healthcare provider right away if you experience any of the following: ? Bleeding from an injury or  your nose that does not stop. ? Unusual colored urine (red or dark brown) or unusual colored stools (red or black). ? Unusual bruising for unknown reasons. ? A serious fall or if you hit your head (even if there is no bleeding).  Some medicines may interact with Xarelto and might increase your risk of bleeding while on Xarelto. To help avoid this, consult your healthcare provider or pharmacist prior to using any new prescription or non-prescription medications, including herbals, vitamins, non-steroidal anti-inflammatory drugs (NSAIDs) and supplements.  This website has more information on Xarelto: [email or web address].

## 2018-07-22 NOTE — Care Management (Addendum)
CM consult for medication assistance acknowledged. Patient has [employer] and therefore would not qualify for any medication assistance. Coumadin is $4 without insurance.   Colleen Can RN, BSN, NCM-BC, ACM-RN [phone]

## 2018-07-22 NOTE — Care Management Note (Signed)
Case Management Note  Patient Details  Name: Randall Beasley MRN: [id number] Date of Birth: 1986/03/20  Subjective/Objective:  33 yo male presented for CP, SOB and LLE pain.                 Action/Plan: CM met with patient to discuss transitional needs. Patient verbalized living at home, independent with ADLs and employed. Patient verbalized as having no PCP nor active health insurance but agreeable to CM assistance. Hospital follow-up appointment arranged with: [facility] on 08/07/18 @ 0910; patient can utilize [facility] for his Rx needs for $4-$10 Rx, with patient verbalizing his ability to afford. [facility] will provide discharge Rx prior to patient transitioning home. No further needs from CM.  Expected Discharge Date:  07/24/18               Expected Discharge Plan:  Home/Self Care  In-House Referral:  NA  Discharge planning Services  CM Consult, Follow-up appt scheduled, Medication Assistance  Post Acute Care Choice:  NA Choice offered to:  NA  DME Arranged:  N/A DME Agency:  NA  HH Arranged:  NA HH Agency:  NA  Status of Service:  Completed, signed off  If discussed at [place] of Stay Meetings, dates discussed:    Additional Comments:  Midge Minium RN, BSN, NCM-BC, ACM-RN [phone] 07/22/2018, 12:05 PM

## 2018-07-22 NOTE — Discharge Summary (Signed)
Physician Discharge Summary  Randall Beasley BSW:[id number] DOB: 1986/05/26 DOA: 07/21/2018  PCP: Randall Presto, MD  Admit date: 07/21/2018 Discharge date: 07/22/2018  Recommendations for Outpatient Follow-up:  1. Please keep follow-up appointments as listed below.  Follow-up Information    [facility]. Go to.   Why:  for your prescription needs. Discounted medications for $4-$10 Contact information: [address] [place] [address] [phone]       [facility]. Go on 08/07/2018.   Why:  at 9:10am for your hospital follow-up appointment Contact information: [address] [place] [address]         Discharge Diagnoses:  Pulmonary embolism and DVT Tobacco abuse Essential hypertension Acute kidney injury  Discharge Condition: Good Disposition: Home  Diet recommendation: Regular  Filed Weights   07/21/18 1800 07/22/18 0050  Weight: 108.9 kg 108.9 kg    History of present illness: Mr. Tough is a 33 year old significant of hypertension, DVT, PE, and tobacco abuse; who presents with chest pain, shortness breath, and left lower leg pain.  Previously on anticoagulation until recent loss of insurance unable to follow-up with primary care provider.   Hospital Course:   Pulmonary embolism and DVT: Hemodynamically stable. Patient's O2 saturations maintained stable on room air.  CT angiogram of the chest showed infarcts of the left upper and lower lobes with possible infarcts or atelectasis noted on the right lung.  Patient was initially started on a heparin drip and transitioned to Xarelto.  Patient was given 1 month supply through transitions of pharmacy.  Tobacco abuse:Did counseling about importance of quitting smoking. Nicotine  patch was offered during his hospitalization  Hypertension: Blood pressures range from 130/70-156/86. Not taking meds at home.  Encouraged  to follow-up with new primary care provider for further monitoring and institute blood pressure medications if needed.  AKI (acute kidney injury) (HCC):  On admission creatinine 1.37 and BUN 13.  Baseline creatinine thought to be around 1.1.symptoms thought to have possibly been due to dehydration.  Patient was given IV fluid of 1 L normal saline followed by 125 cc/h until mid afternoon on hospital day 2.  Repeat creatinine slightly improved from previous to 1.27.  Patient encouraged to keep adequately hydrated  Discharge Instructions   Allergies as of 07/22/2018      Reactions   Bee Venom Swelling   Swells at site of sting, no breathing impairment      Medication List    STOP taking these medications   aspirin EC 81 MG tablet   warfarin 5 MG tablet Commonly known as:  COUMADIN     TAKE these medications   ibuprofen 200 MG tablet Commonly known as:  ADVIL,MOTRIN Take 400-600 mg by mouth every 8 (eight) hours as needed (for pain).   Rivaroxaban 15 & 20 MG Tbpk Take as directed on package: Start with one 15mg  tablet by mouth twice a day with food. On Day 22, switch to one 20mg  tablet once a day with food.      Allergies  Allergen Reactions  . Bee Venom Swelling    Swells at site of sting, no breathing impairment    The results of significant diagnostics from this hospitalization (including imaging, microbiology, ancillary and laboratory) are listed below for reference.    Significant Diagnostic Studies: Dg Chest 2 View  Result Date: 07/21/2018 CLINICAL DATA:  33 y/o M; central chest pain, shortness of breath, left leg pain. History of  DVT and PE. EXAM: CHEST - 2 VIEW COMPARISON:  07/24/2017 chest radiograph. FINDINGS: Stable heart size and mediastinal contours are within normal limits. Both lungs are clear. The visualized skeletal structures are unremarkable. IMPRESSION: No acute pulmonary process identified Electronically Signed   By: Mitzi HansenLance  Furusawa-Stratton M.D.   On:  07/21/2018 17:23   Ct Angio Chest Pe W And/or Wo Contrast  Result Date: 07/21/2018 CLINICAL DATA:  Chest pain with shortness of breath. Lower extremity DVT diagnosed today. History of PE. EXAM: CT ANGIOGRAPHY CHEST WITH CONTRAST TECHNIQUE: Multidetector CT imaging of the chest was performed using the standard protocol during bolus administration of intravenous contrast. Multiplanar CT image reconstructions and MIPs were obtained to evaluate the vascular anatomy. CONTRAST:  100mL ISOVUE-370 IOPAMIDOL (ISOVUE-370) INJECTION 76% COMPARISON:  08/07/2017 FINDINGS: Cardiovascular: Pulmonary arterial opacification is adequate without definite segmental or more proximal pulmonary emboli. There is a subsegmental embolus involving the anterior left upper lobe (series 7, image 126). Subsegmental emboli are also suspected in the left lower lobe with assessment limited by motion artifact (for example series 7, image 223). The heart is borderline enlarged. There is no pericardial effusion. The thoracic aorta is normal in caliber. Mediastinum/Nodes: No enlarged axillary, mediastinal, or hilar lymph nodes. Grossly unremarkable esophagus and included portion of the thyroid. Lungs/Pleura: No pleural effusion or pneumothorax. 1.9 cm focus of mixed ground-glass and consolidative opacity in the basilar right lower lobe abutting the major fissure (series 6, image 80). Similar opacity in the anteromedial left upper lobe (series 6, image 57). Upper Abdomen: Partially visualized left upper pole renal calculus, also present previously. Musculoskeletal: No acute osseous abnormality or suspicious osseous lesion. Review of the MIP images confirms the above findings. IMPRESSION: 1. Subsegmental pulmonary emboli in the left upper and lower lobes. 2. Focal opacities in the right lower lobe and left upper lobe, nonspecific but could reflect atelectasis or infarcts. Critical Value/emergent results were called by telephone at the time of  interpretation on 07/21/2018 at 7:49 pm to [facility] , who verbally acknowledged these results. Electronically Signed   By: Sebastian AcheAllen  Grady M.D.   On: 07/21/2018 19:50   Vas Koreas Lower Extremity Venous (dvt) (only [facility])  Result Date: 07/21/2018  Lower Venous Study Indications: Pain.  Performing Technologist: Chanda BusingGregory Collins RVT  Examination Guidelines: A complete evaluation includes B-mode imaging, spectral Doppler, color Doppler, and power Doppler as needed of all accessible portions of each vessel. Bilateral testing is considered an integral part of a complete examination. Limited examinations for reoccurring indications may be performed as noted.  Right Venous Findings: +---+---------------+---------+-----------+----------+-------+    CompressibilityPhasicitySpontaneityPropertiesSummary +---+---------------+---------+-----------+----------+-------+ CFVFull           Yes      Yes                          +---+---------------+---------+-----------+----------+-------+  Left Venous Findings: +---------+---------------+---------+-----------+----------+-------+          CompressibilityPhasicitySpontaneityPropertiesSummary +---------+---------------+---------+-----------+----------+-------+ CFV      Full           Yes      Yes                          +---------+---------------+---------+-----------+----------+-------+ SFJ      Full                                                 +---------+---------------+---------+-----------+----------+-------+  FV Prox  Full                                                 +---------+---------------+---------+-----------+----------+-------+ FV Mid   Full                                                 +---------+---------------+---------+-----------+----------+-------+ FV DistalFull                                                 +---------+---------------+---------+-----------+----------+-------+ PFV      Full                                                  +---------+---------------+---------+-----------+----------+-------+ POP      Partial        No       No                   Acute   +---------+---------------+---------+-----------+----------+-------+ PTV      Full                                                 +---------+---------------+---------+-----------+----------+-------+ PERO     Full                                                 +---------+---------------+---------+-----------+----------+-------+ Gastroc  Full                                         Acute   +---------+---------------+---------+-----------+----------+-------+    Summary: Right: No evidence of common femoral vein obstruction. Left: Findings consistent with acute deep vein thrombosis involving the left popliteal vein, and left gastrocnemius vein. No cystic structure found in the popliteal fossa.  *See table(s) above for measurements and observations. Electronically signed by Tonny Bollman MD on 07/21/2018 at 8:05:12 PM.    Final     Microbiology: No results found for this or any previous visit (from the past 240 hour(s)).   Labs: Basic Metabolic Panel: Recent Labs  Lab 07/21/18 1644 07/22/18 0141  NA 139 138  K 4.0 3.4*  CL 104 107  CO2 26 23  GLUCOSE 85 104*  BUN 13 12  CREATININE 1.37* 1.27*  CALCIUM 9.6 8.5*   Liver Function Tests: No results for input(s): AST, ALT, ALKPHOS, BILITOT, PROT, ALBUMIN in the last 168 hours. No results for input(s): LIPASE, AMYLASE in the last 168 hours. No results for input(s): AMMONIA in the last 168 hours. CBC: Recent Labs  Lab 07/21/18 1644 07/22/18 0141  WBC 10.9* 9.0  HGB 15.4 14.4  HCT 45.2 41.5  MCV 92.4 90.2  PLT 236 207   Cardiac Enzymes: No results for input(s): CKTOTAL, CKMB, CKMBINDEX, TROPONINI in the last 168 hours. BNP: BNP (last 3 results) Recent Labs    07/21/18 2144  BNP 5.2    ProBNP (last 3 results) No results for input(s):  PROBNP in the last 8760 hours.  CBG: No results for input(s): GLUCAP in the last 168 hours.  Principal Problem:   Pulmonary embolism (HCC) Active Problems:   DVT (deep venous thrombosis) (HCC)   Tobacco abuse   Hypertension   AKI (acute kidney injury) (HCC)

## 2018-07-22 NOTE — Progress Notes (Addendum)
ANTICOAGULATION CONSULT NOTE   Pharmacy Consult for warfarin Indication: pulmonary embolus  Allergies  Allergen Reactions  . Bee Venom Swelling    Swells at site of sting, no breathing impairment    Patient Measurements: Height: 5\' 10"  (177.8 cm) Weight: 240 lb (108.9 kg) IBW/kg (Calculated) : 73 Heparin Dosing Weight: 96.5 kg  Vital Signs: Temp: 98.1 F (36.7 C) (01/08 0645) Temp Source: Oral (01/08 0645) BP: 130/70 (01/08 0645) Pulse Rate: 69 (01/08 0645)  Labs: Recent Labs    07/21/18 1644 07/22/18 0141  HGB 15.4 14.4  HCT 45.2 41.5  PLT 236 207  LABPROT 13.8  --   INR 1.07  --   HEPARINUNFRC  --  0.50  CREATININE 1.37* 1.27*    Estimated Creatinine Clearance: 103.2 mL/min (A) (by C-G formula based on SCr of 1.27 mg/dL (H)).   Medical History: Past Medical History:  Diagnosis Date  . Arthritis   . DVT (deep venous thrombosis) (HCC)   . Hypertension   . Pulmonary HTN (HCC)     Medications:  Scheduled:  . nicotine  21 mg Transdermal Daily    Assessment: 32 yom with hx of DVT (on Xarelto in 2016, changed to warfarin due to cost issues - stopped ~1.5 months ago). Presenting with chest pain, SOB, and L leg pain.   Venous duplex showing evidence of acute DVT involving popliteal and intramuscular gastrocnemius veins of LLE. Hgb 15.4, plt 236. D-dimer 4.8. CT chest indicates pulmonary emboli. No s/sx of bleeding. Initial heparin level 0.50 units/ml  Baseline INR 1.07, d/w patient. Most recent dose of warfarin was a combination of 7.5mg  and 10mg  daily (patient can't recall exact dose regimen).   Goal of Therapy:  Heparin level 0.3-0.7 units/ml Monitor platelets by anticoagulation protocol: Yes   Plan:  Warfarin 10mg  PO x 1 tonight Daily INR  Clayburn Weekly A. Jeanella Craze, PharmD, BCPS Clinical Pharmacist [place] Pager: [phone] Please utilize Amion for appropriate phone number to reach the unit pharmacist [facility])  Addendum:  Decision has been made  to change patient to rivaroxaban instead. Will dose rivaroxaban at 15mg  BID for 21 days, then 20mg  Daily. CM will try to arrange for free starter pack through manufacturer program.   Plan:  D/C warfarin Rivaroxaban 15mg  BID x 21 days, then 20mg  daily thereafter D/C heparin once first dose of rivaroxaban given.   Imran Nuon A. Jeanella Craze, PharmD, BCPS Clinical Pharmacist [place] Pager: [phone] Please utilize Amion for appropriate phone number to reach the unit pharmacist [facility])

## 2018-07-23 LAB — HOMOCYSTEINE: Homocysteine: 19.4 umol/L — ABNORMAL HIGH (ref 0.0–15.0)

## 2018-07-24 LAB — DRVVT MIX: dRVVT Mix: 41.7 s (ref 0.0–47.0)

## 2018-07-24 LAB — BETA-2-GLYCOPROTEIN I ABS, IGG/M/A
Beta-2 Glyco I IgG: <9 GPI IgG units (ref 0–20)
Beta-2-Glycoprotein I IgA: <9 GPI IgA units (ref 0–25)
Beta-2-Glycoprotein I IgM: <9 GPI IgM units (ref 0–32)

## 2018-07-24 LAB — CARDIOLIPIN ANTIBODIES, IGG, IGM, IGA
Anticardiolipin IgA: <9 APL U/mL (ref 0–11)
Anticardiolipin IgG: <9 GPL U/mL (ref 0–14)
Anticardiolipin IgM: <9 MPL U/mL (ref 0–12)

## 2018-07-24 LAB — PROTEIN S ACTIVITY: PROTEIN S ACTIVITY: 93 % (ref 63–140)

## 2018-07-24 LAB — PTT-LA MIX: PTT-LA MIX: 49.2 s — AB (ref 0.0–48.9)

## 2018-07-24 LAB — PROTEIN C ACTIVITY: Protein C Activity: 115 % (ref 73–180)

## 2018-07-24 LAB — HEXAGONAL PHASE PHOSPHOLIPID: HEXAGONAL PHASE PHOSPHOLIPID: 14 s — AB (ref 0–11)

## 2018-07-24 LAB — PROTEIN C, TOTAL: Protein C, Total: 95 % (ref 60–150)

## 2018-07-24 LAB — LUPUS ANTICOAGULANT PANEL
DRVVT: 49.1 s — ABNORMAL HIGH (ref 0.0–47.0)
PTT Lupus Anticoagulant: 53.5 s — ABNORMAL HIGH (ref 0.0–51.9)

## 2018-07-24 LAB — PROTEIN S, TOTAL: Protein S Ag, Total: 88 % (ref 60–150)

## 2018-07-27 LAB — FACTOR 5 LEIDEN

## 2018-07-28 LAB — PROTHROMBIN GENE MUTATION

## 2018-07-29 ENCOUNTER — Emergency Department (HOSPITAL_COMMUNITY)
Admission: EM | Admit: 2018-07-29 | Discharge: 2018-07-29 | Disposition: A | Discharge status: Home / Self Care | Payer: Self-pay | Attending: Emergency Medicine | Admitting: Emergency Medicine

## 2018-07-29 ENCOUNTER — Emergency Department (HOSPITAL_COMMUNITY): Payer: Self-pay

## 2018-07-29 DIAGNOSIS — M79672 Pain in left foot: Secondary | ICD-10-CM | POA: Insufficient documentation

## 2018-07-29 DIAGNOSIS — I10 Essential (primary) hypertension: Secondary | ICD-10-CM | POA: Insufficient documentation

## 2018-07-29 DIAGNOSIS — Z86718 Personal history of other venous thrombosis and embolism: Secondary | ICD-10-CM | POA: Insufficient documentation

## 2018-07-29 DIAGNOSIS — F17210 Nicotine dependence, cigarettes, uncomplicated: Secondary | ICD-10-CM | POA: Insufficient documentation

## 2018-07-29 NOTE — ED Provider Notes (Signed)
[facility] Provider Note   CSN: [id number] Arrival date & time: 07/29/18  [id number]     History   Chief Complaint Chief Complaint  Patient presents with  . Foot Pain    HPI Randall Beasley is a 33 y.o. male.  HPI   33 year old male presents today with complaints of foot pain.  Patient notes that approximately 1 week ago he developed pain to his left foot.  Patient notes this is worse with ambulation, worse in the morning, he notes its worst over the dorsal midfoot.  He denies any redness or swelling.  He notes he was seen in the emergency room, he was diagnosed with pulmonary embolism and DVT at that time.  Patient notes continued pain in the foot, but notes no chest pain shortness of breath or upper extremity pain.  No trauma to the foot.  Past Medical History:  Diagnosis Date  . Arthritis   . DVT (deep venous thrombosis) (HCC)   . Hypertension   . Pulmonary HTN [facility](HCC)     Patient Active Problem List   Diagnosis Date Noted  . DVT (deep venous thrombosis) (HCC) 07/21/2018  . Tobacco abuse 07/21/2018  . AKI (acute kidney injury) (HCC) 07/21/2018  . Hypertension   . Pulmonary embolism (HCC) 08/18/2014  . H/O thrombosis 04/25/2014    Past Surgical History:  Procedure Laterality Date  . dental procedure          Home Medications    Prior to Admission medications   Medication Sig Start Date End Date Taking? Authorizing Provider  ibuprofen (ADVIL,MOTRIN) 200 MG tablet Take 400-600 mg by mouth every 8 (eight) hours as needed (for pain).     [provider]  Rivaroxaban 15 & 20 MG TBPK Take as directed on package: Start with one 15mg  tablet by mouth twice a day with food. On Day 22, switch to one 20mg  tablet once a day with food. 07/22/18   Clydie BraunSmith, Rondell A, MD    Family History Family History  Problem Relation Age of Onset  . Diabetes Mellitus II Mother     Social History Social History   Tobacco Use  . Smoking  status: Current Some Day Smoker    Packs/day: 1.00    Types: Cigarettes  . Smokeless tobacco: Former Engineer, waterUser  Substance Use Topics  . Alcohol use: Not Currently    Alcohol/week: 0.0 standard drinks  . Drug use: Never     Allergies   Bee venom   Review of Systems Review of Systems  All other systems reviewed and are negative.    Physical Exam Updated Vital Signs BP (!) 148/107 (BP Location: Right Arm)   Pulse 86   Temp 98.7 F (37.1 C) (Oral)   Resp 16   SpO2 99%   Physical Exam Vitals signs and nursing note reviewed.  Constitutional:      Appearance: He is well-developed.  HENT:     Head: Normocephalic and atraumatic.  Eyes:     General: No scleral icterus.       Right eye: No discharge.        Left eye: No discharge.     Conjunctiva/sclera: Conjunctivae normal.     Pupils: Pupils are equal, round, and reactive to light.  Neck:     Musculoskeletal: Normal range of motion.     Vascular: No JVD.     Trachea: No tracheal deviation.  Pulmonary:     Effort: Pulmonary effort is normal.  Breath sounds: No stridor.  Musculoskeletal:     Comments: Left foot atraumatic no redness swelling or warmth, tenderness over the mid dorsal foot, cap refill intact no pain with plantar dorsiflexion  Neurological:     Mental Status: He is alert and oriented to person, place, and time.     Coordination: Coordination normal.  Psychiatric:        Behavior: Behavior normal.        Thought Content: Thought content normal.        Judgment: Judgment normal.      ED Treatments / Results  Labs (all labs ordered are listed, but only abnormal results are displayed) Labs Reviewed - No data to display  EKG None  Radiology Dg Foot Complete Left  Result Date: 07/29/2018 CLINICAL DATA:  Left foot pain for 1 week, no known injury, initial encounter EXAM: LEFT FOOT - COMPLETE 3+ VIEW COMPARISON:  None. FINDINGS: Hallux valgus deformity is noted. No acute fracture or dislocation is  seen. No soft tissue abnormality is noted. IMPRESSION: No acute abnormality noted. Electronically Signed   By: Alcide Clever M.D.   On: 07/29/2018 10:36    Procedures Procedures (including critical care time)  Medications Ordered in ED Medications - No data to display   Initial Impression / Assessment and Plan / ED Course  I have reviewed the triage vital signs and the nursing notes.  Pertinent labs & imaging results that were available during my care of the patient were reviewed by me and considered in my medical decision making (see chart for details).     33 year old male presents today with foot pain.  He has no signs of infectious etiology, no edema.  Patient has no other concerning signs or symptoms taking his medication as directed.  He will be discharged with outpatient podiatry follow-up and return precautions.  He verbalized understanding and agreement to today's plan  Final Clinical Impressions(s) / ED Diagnoses   Final diagnoses:  Left foot pain    ED Discharge Orders    None       Rosalio Loud 07/29/18 1108    Jacalyn Lefevre, MD 07/29/18 1237

## 2018-07-29 NOTE — Discharge Instructions (Addendum)
Please read attached information. If you experience any new or worsening signs or symptoms please return to the emergency room for evaluation. Please follow-up with your primary care provider or specialist as discussed.  °

## 2018-07-29 NOTE — ED Triage Notes (Signed)
Pt here for left foot pain, pt was here 1 week ago for a dvt but just wants to be seen for his foot pain today

## 2018-08-05 ENCOUNTER — Encounter: Payer: Self-pay | Admitting: Family Medicine

## 2018-08-07 ENCOUNTER — Encounter: Payer: Self-pay | Admitting: Family Medicine

## 2018-08-07 ENCOUNTER — Ambulatory Visit (INDEPENDENT_AMBULATORY_CARE_PROVIDER_SITE_OTHER): Payer: Self-pay | Admitting: Family Medicine

## 2018-08-07 VITALS — BP 149/108 | HR 76 | Resp 17 | Ht 71.0 in | Wt 262.8 lb

## 2018-08-07 DIAGNOSIS — I2694 Multiple subsegmental pulmonary emboli without acute cor pulmonale: Secondary | ICD-10-CM

## 2018-08-07 DIAGNOSIS — Z6836 Body mass index (BMI) 36.0-36.9, adult: Secondary | ICD-10-CM

## 2018-08-07 DIAGNOSIS — Z7689 Persons encountering health services in other specified circumstances: Secondary | ICD-10-CM

## 2018-08-07 DIAGNOSIS — I10 Essential (primary) hypertension: Secondary | ICD-10-CM

## 2018-08-07 DIAGNOSIS — R7989 Other specified abnormal findings of blood chemistry: Secondary | ICD-10-CM

## 2018-08-07 DIAGNOSIS — E669 Obesity, unspecified: Secondary | ICD-10-CM

## 2018-08-07 DIAGNOSIS — I824Y9 Acute embolism and thrombosis of unspecified deep veins of unspecified proximal lower extremity: Secondary | ICD-10-CM

## 2018-08-07 MED ORDER — LOSARTAN POTASSIUM 25 MG PO TABS: 25.0000 mg | ORAL_TABLET | Freq: Every day | ORAL | 2 refills | Status: AC

## 2018-08-07 MED ORDER — RIVAROXABAN 20 MG PO TABS: 20.0000 mg | ORAL_TABLET | Freq: Every day | ORAL | 3 refills | Status: AC

## 2018-08-07 NOTE — Patient Instructions (Addendum)
[facility]    Randall Beasley was seen by Joaquin CourtsKimberly Harris, FNP today.   Talin Corso's primary care provider is Bing NeighborsHarris, Kimberly S, FNP.   For the best care possible, you should try to see Joaquin CourtsKimberly Harris, FNP-C whenever you come to the clinic.   We look forward to seeing you again soon!  If you have any questions about your visit today, please call us at [phone] or feel free to reach your primary care provider via MyChart.    Steps to Quit Smoking  Smoking tobacco can be bad for your health. It can also affect almost every organ in your body. Smoking puts you and people around you at risk for many serious long-lasting (chronic) diseases. Quitting smoking is hard, but it is one of the best things that you can do for your health. It is never too late to quit. What are the benefits of quitting smoking? When you quit smoking, you lower your risk for getting serious diseases and conditions. They can include:  Lung cancer or lung disease.  Heart disease.  Stroke.  Heart attack.  Not being able to have children (infertility).  Weak bones (osteoporosis) and broken bones (fractures). If you have coughing, wheezing, and shortness of breath, those symptoms may get better when you quit. You may also get sick less often. If you are pregnant, quitting smoking can help to lower your chances of having a baby of low birth weight. What can I do to help me quit smoking? Talk with your doctor about what can help you quit smoking. Some things you can do (strategies) include:  Quitting smoking totally, instead of slowly cutting back how much you smoke over a period of time.  Going to in-person counseling. You are more likely to quit if you go to many counseling sessions.  Using resources and support systems, such as: ? Agricultural engineernline chats with a Veterinary surgeoncounselor. ? Phone quitlines. ? Automotive engineerrinted self-help materials. ? Support groups or group  counseling. ? Text messaging programs. ? Mobile phone apps or applications.  Taking medicines. Some of these medicines may have nicotine in them. If you are pregnant or breastfeeding, do not take any medicines to quit smoking unless your doctor says it is okay. Talk with your doctor about counseling or other things that can help you. Talk with your doctor about using more than one strategy at the same time, such as taking medicines while you are also going to in-person counseling. This can help make quitting easier. What things can I do to make it easier to quit? Quitting smoking might feel very hard at first, but there is a lot that you can do to make it easier. Take these steps:  Talk to your family and friends. Ask them to support and encourage you.  Call phone quitlines, reach out to support groups, or work with a Veterinary surgeoncounselor.  Ask people who smoke to not smoke around you.  Avoid places that make you want (trigger) to smoke, such as: ? Bars. ? Parties. ? Smoke-break areas at work.  Spend time with people who do not smoke.  Lower the stress in your life. Stress can make you want to smoke. Try these things to help your stress: ? Getting regular exercise. ? Deep-breathing exercises. ? Yoga. ? Meditating. ? Doing a body scan. To do this, close your eyes, focus on one area of your body at a time from head to toe, and  notice which parts of your body are tense. Try to relax the muscles in those areas.  Download or buy apps on your mobile phone or tablet that can help you stick to your quit plan. There are many free apps, such as QuitGuide from the [employer] developer(Centers for Disease Control and Prevention). You can find more support from smokefree.gov and other websites. This information is not intended to replace advice given to you by your health care provider. Make sure you discuss any questions you have with your health care provider. Document Released: 04/27/2009 Document Revised: 02/27/2016  Document Reviewed: 11/15/2014 Elsevier Interactive Patient Education  2019 [employer]  Hypertension Hypertension is another name for high blood pressure. High blood pressure forces your heart to work harder to pump blood. This can cause problems over time. There are two numbers in a blood pressure reading. There is a top number (systolic) over a bottom number (diastolic). It is best to have a blood pressure below 120/80. Healthy choices can help lower your blood pressure. You may need medicine to help lower your blood pressure if:  Your blood pressure cannot be lowered with healthy choices.  Your blood pressure is higher than 130/80. Follow these instructions at home: Eating and drinking   If directed, follow the DASH eating plan. This diet includes: ? Filling half of your plate at each meal with fruits and vegetables. ? Filling one quarter of your plate at each meal with whole grains. Whole grains include whole wheat pasta, brown rice, and whole grain bread. ? Eating or drinking low-fat dairy products, such as skim milk or low-fat yogurt. ? Filling one quarter of your plate at each meal with low-fat (lean) proteins. Low-fat proteins include fish, skinless chicken, eggs, beans, and tofu. ? Avoiding fatty meat, cured and processed meat, or chicken with skin. ? Avoiding premade or processed food.  Eat less than 1,500 mg of salt (sodium) a day.  Limit alcohol use to no more than 1 drink a day for nonpregnant women and 2 drinks a day for men. One drink equals 12 oz of beer, 5 oz of wine, or 1 oz of hard liquor. Lifestyle  Work with your doctor to stay at a healthy weight or to lose weight. Ask your doctor what the best weight is for you.  Get at least 30 minutes of exercise that causes your heart to beat faster (aerobic exercise) most days of the week. This may include walking, swimming, or biking.  Get at least 30 minutes of exercise that strengthens your muscles (resistance  exercise) at least 3 days a week. This may include lifting weights or pilates.  Do not use any products that contain nicotine or tobacco. This includes cigarettes and e-cigarettes. If you need help quitting, ask your doctor.  Check your blood pressure at home as told by your doctor.  Keep all follow-up visits as told by your doctor. This is important. Medicines  Take over-the-counter and prescription medicines only as told by your doctor. Follow directions carefully.  Do not skip doses of blood pressure medicine. The medicine does not work as well if you skip doses. Skipping doses also puts you at risk for problems.  Ask your doctor about side effects or reactions to medicines that you should watch for. Contact a doctor if:  You think you are having a reaction to the medicine you are taking.  You have headaches that keep coming back (recurring).  You feel dizzy.  You have swelling in your ankles.  You have trouble with your vision. Get help right away if:  You get a very bad headache.  You start to feel confused.  You feel weak or numb.  You feel faint.  You get very bad pain in your: ? Chest. ? Belly (abdomen).  You throw up (vomit) more than once.  You have trouble breathing. Summary  Hypertension is another name for high blood pressure.  Making healthy choices can help lower blood pressure. If your blood pressure cannot be controlled with healthy choices, you may need to take medicine. This information is not intended to replace advice given to you by your health care provider. Make sure you discuss any questions you have with your health care provider. Document Released: 12/18/2007 Document Revised: 05/29/2016 Document Reviewed: 05/29/2016 Elsevier Interactive Patient Education  2019 [employer].

## 2018-08-07 NOTE — Progress Notes (Signed)
Randall Beasley, is a 33 y.o. male  ZOX:[id number]  [id number]  DOB - 1985-08-26  CC:  Chief Complaint  Patient presents with  . Establish Care  . Hospitalization Follow-up    ED->Hosp 1/7-1/8: multiple pulm emboli, acute DVT of LLE. breathing is ok. no chest pain. has slight gum bleeding when brushing teeth       HPI: Randall Beasley is a 33 y.o. male is here today to establish care, hypertension evaluation and hospital follow-up for DVT.  Randall Beasley has H/O thrombosis; Pulmonary embolism (HCC); DVT (deep venous thrombosis) (HCC); Tobacco abuse; Hypertension; and AKI (acute kidney injury) (HCC) on their problem list.   Hypercoagulable State-DVT/PE's  [facility] after presenting to the ER with a complaint of chest pain, shortness of breath and left lower leg pain after abruptly adding chronic anticoagulation after loss of health insurance.  She has a history of hypercoagulable state. Had multiple DVTs and PEs. He is a current daily smoker and suffers from obesity. At some point he is been anticoagulated with warfarin and Xarelto. ER work-up was significant for PE in the left lower lobe and left upper lobe of the lung and he also was found to have an acute DVT of the left lower extremity. He was discharged the following day prescribed Xarelto starter pack which he was able to obtain.  Hypertension  Randall Beasley reports no home monitoring of blood pressure. He has been without blood pressure medication for almost a year.  Previously prescribed occasions for blood pressure which he is uncertain of.  Current daily smoker.  This is a poor diet with high sodium intake.  Engages in no routine physical activity.  No recent dizziness, chest pain, or shortness of breath since recent hospitalization.    Current medications: Current Outpatient Medications:  .  ibuprofen (ADVIL,MOTRIN) 200 MG tablet, Take 400-600 mg by mouth every 8 (eight) hours as needed (for  pain). , Disp: , Rfl:  .  Rivaroxaban 15 & 20 MG TBPK, Take as directed on package: Start with one 15mg  tablet by mouth twice a day with food. On Day 22, switch to one 20mg  tablet once a day with food., Disp: 51 each, Rfl: 0   Pertinent family medical history: family history includes Breast cancer in his maternal aunt; Diabetes Mellitus II in his mother; Healthy in his daughter, daughter, father, and son; Hypertension in his brother; Kidney disease in his mother.   Allergies  Allergen Reactions  . Bee Venom Swelling    Swells at site of sting, no breathing impairment    Social History   Socioeconomic History  . Marital status: Single    Spouse name: Not on file  . Number of children: Not on file  . Years of education: Not on file  . Highest education level: Not on file  Occupational History  . Not on file  Social Needs  . Financial resource strain: Not on file  . Food insecurity:    Worry: Not on file    Inability: Not on file  . Transportation needs:    Medical: Not on file    Non-medical: Not on file  Tobacco Use  . Smoking status: Current Some Day Smoker    Packs/day: 1.00    Types: Cigarettes  . Smokeless tobacco: Former Engineer, waterUser  Substance and Sexual Activity  . Alcohol use: Not Currently    Alcohol/week: 0.0 standard drinks  . Drug use: Never  . Sexual activity: Not on file  Lifestyle  . Physical activity:    Days per week: Not on file    Minutes per session: Not on file  . Stress: Not on file  Relationships  . Social connections:    Talks on phone: Not on file    Gets together: Not on file    Attends religious service: Not on file    Active member of club or organization: Not on file    Attends meetings of clubs or organizations: Not on file    Relationship status: Not on file  . Intimate partner violence:    Fear of current or ex partner: Not on file    Emotionally abused: Not on file    Physically abused: Not on file    Forced sexual activity: Not on file   Other Topics Concern  . Not on file  Social History Narrative  . Not on file    Review of Systems: Constitutional: Negative for fever, chills, diaphoresis, activity change, appetite change and fatigue. HENT: Negative for ear pain, nosebleeds, congestion, facial swelling, rhinorrhea, neck pain, neck stiffness and ear discharge.  Eyes: Negative for pain, discharge, redness, itching and visual disturbance. Respiratory: Negative for cough, choking, chest tightness, shortness of breath, wheezing and stridor.  Cardiovascular: Negative for chest pain, palpitations and leg swelling. Gastrointestinal: Negative for abdominal distention. Genitourinary: Negative for dysuria, urgency, frequency, hematuria, flank pain, decreased urine volume, difficulty urinating. Musculoskeletal: Negative for back pain, joint swelling, arthralgia and gait problem. Neurological: Negative for dizziness, tremors, seizures, syncope, facial asymmetry, speech difficulty, weakness, light-headedness, numbness and headaches.  Hematological: Negative for adenopathy. Does not bruise/bleed easily. Psychiatric/Behavioral: Negative for hallucinations, behavioral problems, confusion, dysphoric mood, decreased concentration and agitation.    Objective:   Vitals:   08/07/18 0909  BP: (!) 149/108  Pulse: 76  Resp: 17  SpO2: 96%    BP Readings from Last 3 Encounters:  08/07/18 (!) 149/108  07/29/18 (!) 148/107  07/22/18 (!) 156/85    Filed Weights   08/07/18 0909  Weight: 262 lb 12.8 oz (119.2 kg)      Physical Exam: Constitutional: Patient appears well-developed and well-nourished. No distress. HENT: Normocephalic, atraumatic, External right and left ear normal. Oropharynx is clear and moist.  Eyes: Conjunctivae and EOM are normal. PERRLA, no scleral icterus. Neck: Normal ROM. Neck supple. No JVD. No tracheal deviation. No thyromegaly. CVS: RRR, S1/S2 +, no murmurs, no gallops, no carotid bruit.  Pulmonary: Effort  and breath sounds normal, no stridor, rhonchi, wheezes, rales.  Abdominal: Soft. BS +, no distension, tenderness, rebound or guarding.  Musculoskeletal: Normal range of motion. No edema and no tenderness.  Neuro: Alert. Normal muscle tone coordination. Normal gait. Skin: Skin is warm and dry. No rash noted. Not diaphoretic. No erythema. No pallor. Psychiatric: Normal mood and affect. Behavior, judgment, thought content normal.  Lab Results (prior encounters)  Lab Results  Component Value Date   WBC 9.0 07/22/2018   HGB 14.4 07/22/2018   HCT 41.5 07/22/2018   MCV 90.2 07/22/2018   PLT 207 07/22/2018   Lab Results  Component Value Date   CREATININE 1.27 (H) 07/22/2018   BUN 12 07/22/2018   NA 138 07/22/2018   K 3.4 (L) 07/22/2018   CL 107 07/22/2018   CO2 23 07/22/2018       Assessment and plan:  1. Encounter to establish care  2. Multiple subsegmental pulmonary emboli without acute cor pulmonale 3. 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  Patient was given financial assistance packet along with information regarding [facility]. Medication along with refills has been prescribed to [facility] pharmacy.  Patient advised not to skip medication or go without picking up prescription due to cost.  Formation also provided regarding the medication past program.  Patient verbalized understanding And was elevated during recent hospitalization we will recheck a CMP to evaluate kidney and liver functioning. Checking:  - Comprehensive metabolic panel  4. Essential hypertension Start losartan 25 mg once daily. Return in 6 weeks for blood pressure check in 3 months for hypertension follow-up. We have discussed target BP range and blood pressure goal. I have advised patient to check BP regularly  and to call us back or report to clinic if the numbers are consistently higher than 140/90. We discussed the importance of compliance with medical therapy and DASH diet recommended, consequences of uncontrolled hypertension discussed.  -Encourage smoking cessation.  5. BMI 36.0-36.9,adult Encouraged efforts to reduce weight include engaging in physical activity as tolerated with goal of 150 minutes per week. Improve dietary choices and eat a meal regimen consistent with a Mediterranean or DASH diet. Reduce simple carbohydrates. Do not skip meals and eat healthy snacks throughout the day to avoid over-eating at dinner. Set a goal weight loss that is achievable for you. Checking - - Hemoglobin A1c - Thyroid Panel With TSH  6. Elevated serum creatinine - CMP   Meds ordered this encounter  Medications  . rivaroxaban (XARELTO) 20 MG TABS tablet    Sig: Take 1 tablet (20 mg total) by mouth daily with supper.    Dispense:  30 tablet    Refill:  3  . losartan (COZAAR) 25 MG tablet    Sig: Take 1 tablet (25 mg total) by mouth daily.    Dispense:  30 tablet    Refill:  2    Return in about 6 weeks (around 09/18/2018) for hypertension follow.   The patient was given clear instructions to go to ER or return to medical center if symptoms don't improve, worsen or new problems develop. The patient verbalized understanding. The patient was advised  to call and obtain lab results if they haven't heard anything from out office within 7-10 business days.  Randall Courts, FNP Primary Care at [facility] [address], [place] [address] [phone]: [phone]    This note has been created with Dragon speech recognition software and Paediatric nurse. Any transcriptional errors are unintentional.

## 2018-08-08 LAB — THYROID PANEL WITH TSH
Free Thyroxine Index: 2.4 (ref 1.2–4.9)
T3 Uptake Ratio: 30 % (ref 24–39)
T4, Total: 8.1 ug/dL (ref 4.5–12.0)
TSH: 0.754 u[IU]/mL (ref 0.450–4.500)

## 2018-08-08 LAB — COMPREHENSIVE METABOLIC PANEL
A/G RATIO: 1.7 (ref 1.2–2.2)
ALT: 23 IU/L (ref 0–44)
AST: 30 IU/L (ref 0–40)
Albumin: 4.5 g/dL (ref 4.0–5.0)
Alkaline Phosphatase: 71 IU/L (ref 39–117)
BUN/Creatinine Ratio: 9 (ref 9–20)
BUN: 13 mg/dL (ref 6–20)
Bilirubin Total: 0.2 mg/dL (ref 0.0–1.2)
CO2: 19 mmol/L — ABNORMAL LOW (ref 20–29)
Calcium: 9.5 mg/dL (ref 8.7–10.2)
Chloride: 104 mmol/L (ref 96–106)
Creatinine, Ser: 1.37 mg/dL — ABNORMAL HIGH (ref 0.76–1.27)
GFR calc Af Amer: 78 mL/min/{1.73_m2} (ref >59)
GFR calc non Af Amer: 68 mL/min/{1.73_m2} (ref >59)
GLOBULIN, TOTAL: 2.7 g/dL (ref 1.5–4.5)
Glucose: 85 mg/dL (ref 65–99)
Potassium: 4.8 mmol/L (ref 3.5–5.2)
Sodium: 142 mmol/L (ref 134–144)
Total Protein: 7.2 g/dL (ref 6.0–8.5)

## 2018-08-08 LAB — HEMOGLOBIN A1C
Est. average glucose Bld gHb Est-mCnc: 123 mg/dL
Hgb A1c MFr Bld: 5.9 % — ABNORMAL HIGH (ref 4.8–5.6)

## 2018-08-10 NOTE — Progress Notes (Signed)
Transitions of Care Follow Up Call Note  Randall Beasley is an 33 y.o. male who presented to [facility] on 07/21/2018.  The patient had the following prescriptions filled at [facility]: [place]   Patient was called by pharmacist and HIPAA identifiers were verified. The following questions were asked about the prescriptions filled at [facility]:  Has the patient been experiencing any side effects to the medications prescribed? no Understanding of regimen: fair Understanding of indications: fair Potential of compliance: fair  [x]  Patient's prescriptions filled at the [facility] Transitions of Care Pharmacy were transferred to the following pharmacy: [facility]  []  Patient unable to be reached after calling three times and prescriptions filled at the [facility] Transitions [facility] Care Pharmacy were transferred to preferred pharmacy found within their chart.   [name] 08/10/2018, 7:57 PM Transitions of Care Pharmacy Hours: Monday - Friday 8:30am to 5:00 PM  Phone - [phone]

## 2018-08-12 ENCOUNTER — Telehealth: Payer: Self-pay | Admitting: Family Medicine

## 2018-08-12 NOTE — Telephone Encounter (Signed)
Patient called requesting lab results, please follow up °

## 2018-09-08 ENCOUNTER — Emergency Department (HOSPITAL_COMMUNITY)
Admission: EM | Admit: 2018-09-08 | Discharge: 2018-09-08 | Disposition: A | Discharge status: Home / Self Care | Payer: Self-pay | Attending: Emergency Medicine | Admitting: Emergency Medicine

## 2018-09-08 ENCOUNTER — Other Ambulatory Visit: Payer: Self-pay

## 2018-09-08 ENCOUNTER — Encounter (HOSPITAL_COMMUNITY): Payer: Self-pay | Admitting: Emergency Medicine

## 2018-09-08 DIAGNOSIS — J069 Acute upper respiratory infection, unspecified: Secondary | ICD-10-CM | POA: Insufficient documentation

## 2018-09-08 DIAGNOSIS — B9789 Other viral agents as the cause of diseases classified elsewhere: Secondary | ICD-10-CM | POA: Insufficient documentation

## 2018-09-08 DIAGNOSIS — R062 Wheezing: Secondary | ICD-10-CM | POA: Insufficient documentation

## 2018-09-08 DIAGNOSIS — F17210 Nicotine dependence, cigarettes, uncomplicated: Secondary | ICD-10-CM | POA: Insufficient documentation

## 2018-09-08 DIAGNOSIS — Z7901 Long term (current) use of anticoagulants: Secondary | ICD-10-CM | POA: Insufficient documentation

## 2018-09-08 DIAGNOSIS — Z79899 Other long term (current) drug therapy: Secondary | ICD-10-CM | POA: Insufficient documentation

## 2018-09-08 DIAGNOSIS — I10 Essential (primary) hypertension: Secondary | ICD-10-CM | POA: Insufficient documentation

## 2018-09-08 DIAGNOSIS — Z720 Tobacco use: Secondary | ICD-10-CM

## 2018-09-08 MED ORDER — METHYLPREDNISOLONE 4 MG PO TBPK: ORAL_TABLET | ORAL | 0 refills | Status: AC

## 2018-09-08 MED ORDER — ALBUTEROL SULFATE 108 (90 BASE) MCG/ACT IN AEPB: 2.0000 | INHALATION_SPRAY | RESPIRATORY_TRACT | 0 refills | Status: AC | PRN

## 2018-09-08 NOTE — Discharge Instructions (Signed)
You appear to have an upper respiratory infection (URI). An upper respiratory tract infection, or cold, is a viral infection of the air passages leading to the lungs. It is contagious and can be spread to others, especially during the first 3 or 4 days. It cannot be cured by antibiotics or other medicines. °RETURN IMMEDIATELY IF you develop shortness of breath, confusion or altered mental status, a new rash, become dizzy, faint, or poorly responsive, or are unable to be cared for at home. ° °

## 2018-09-08 NOTE — ED Triage Notes (Signed)
Pt c/o nasal congestion and cough x 1 day. Denies chest pain/shortness of breath, afebrile in triage.

## 2018-09-08 NOTE — ED Provider Notes (Addendum)
[facility] EMERGENCY DEPARTMENT Provider Note   CSN: [id number] Arrival date & time: 09/08/18  1736    History   Chief Complaint Chief Complaint  Patient presents with  . Cough  . Nasal Congestion    HPI Randall Beasley is a 33 y.o. male who presents emergency department chief complaint of URI symptoms and wheezing.  He is a daily smoker.  Patient states that his 2 stepdaughters both had upper respiratory symptoms and he began feeling really poorly at work yesterday.  Today while he was at work he had several fits of severe coughing, difficulty catching his breath and wheezing.  He has associated sore throat, headache, body aches, productive cough with clear sputum.  He denies fevers, chills, abdominal pain, nausea vomiting or diarrhea.     HPI  Past Medical History:  Diagnosis Date  . Adjustment disorder with mixed disturbance of emotions and conduct   . Arthritis   . DVT (deep venous thrombosis) (HCC)   . Encounter for current long-term use of anticoagulants   . Essential hypertension   . Gout   . Pulmonary HTN [facility])     Patient Active Problem List   Diagnosis Date Noted  . DVT (deep venous thrombosis) (HCC) 07/21/2018  . Tobacco abuse 07/21/2018  . AKI (acute kidney injury) (HCC) 07/21/2018  . Hypertension   . Pulmonary embolism (HCC) 08/18/2014  . H/O thrombosis 04/25/2014    Past Surgical History:  Procedure Laterality Date  . dental procedure          Home Medications    Prior to Admission medications   Medication Sig Start Date End Date Taking? Authorizing Provider  Albuterol Sulfate (PROAIR RESPICLICK) 108 (90 Base) MCG/ACT AEPB Inhale 2 puffs into the lungs every 4 (four) hours as needed (cough and wheezing). 09/08/18   Arthor Captain, PA-C  ibuprofen (ADVIL,MOTRIN) 200 MG tablet Take 400-600 mg by mouth every 8 (eight) hours as needed (for pain).     [provider]  losartan (COZAAR) 25 MG tablet Take 1 tablet (25 mg  total) by mouth daily. 08/07/18   Bing Neighbors, FNP  methylPREDNISolone (MEDROL DOSEPAK) 4 MG TBPK tablet Use as directed 09/08/18   Arthor Captain, PA-C  rivaroxaban (XARELTO) 20 MG TABS tablet Take 1 tablet (20 mg total) by mouth daily with supper. 08/07/18   Bing Neighbors, FNP  Rivaroxaban 15 & 20 MG TBPK Take as directed on package: Start with one 15mg  tablet by mouth twice a day with food. On Day 22, switch to one 20mg  tablet once a day with food. 07/22/18   Clydie Braun, MD    Family History Family History  Problem Relation Age of Onset  . Diabetes Mellitus II Mother   . Kidney disease Mother   . Healthy Father   . Hypertension Brother   . Healthy Daughter   . Healthy Daughter   . Healthy Son   . Breast cancer Maternal Aunt     Social History Social History   Tobacco Use  . Smoking status: Current Some Day Smoker    Packs/day: 1.00    Types: Cigarettes  . Smokeless tobacco: Former Engineer, water Use Topics  . Alcohol use: Not Currently    Alcohol/week: 0.0 standard drinks  . Drug use: Never     Allergies   Bee venom   Review of Systems Review of Systems  Ten systems reviewed and are negative for acute change, except as noted in the HPI.  Physical Exam Updated Vital Signs BP (!) 155/85 (BP Location: Right Arm)   Pulse 75   Temp 98.4 F (36.9 C) (Oral)   Resp 16   SpO2 98%   Physical Exam Vitals signs and nursing note reviewed.  Constitutional:      General: He is not in acute distress.    Appearance: He is well-developed. He is ill-appearing. He is not diaphoretic.  HENT:     Head: Normocephalic and atraumatic.     Nose: Congestion present.     Mouth/Throat:     Mouth: Mucous membranes are moist.  Eyes:     General: No scleral icterus.    Conjunctiva/sclera: Conjunctivae normal.  Neck:     Musculoskeletal: Normal range of motion and neck supple.  Cardiovascular:     Rate and Rhythm: Normal rate and regular rhythm.     Heart  sounds: Normal heart sounds.  Pulmonary:     Effort: Pulmonary effort is normal. No respiratory distress.     Breath sounds: No stridor. Wheezing present. No rhonchi.  Abdominal:     Palpations: Abdomen is soft.     Tenderness: There is no abdominal tenderness.  Skin:    General: Skin is warm and dry.  Neurological:     Mental Status: He is alert.  Psychiatric:        Behavior: Behavior normal.      ED Treatments / Results  Labs (all labs ordered are listed, but only abnormal results are displayed) Labs Reviewed - No data to display  EKG None  Radiology No results found.  Procedures Procedures (including critical care time)  Medications Ordered in ED Medications - No data to display   Initial Impression / Assessment and Plan / ED Course  I have reviewed the triage vital signs and the nursing notes.  Pertinent labs & imaging results that were available during my care of the patient were reviewed by me and considered in my medical decision making (see chart for details).        Patient with URI symptoms, bronchitis and wheezing.  Patient will be given Medrol Dosepak and albuterol inhaler.  May use over-the-counter cold medications.  Discussed return precautions.  Patient appears appropriate for discharge at this time. The patient was counseled on the dangers of tobacco use, and was advised to quit. Reviewed strategies to maximize success, including removing cigarettes and smoking materials from environment, stress management, substitution of other forms of reinforcement, support of family/friends and written materials.  Final Clinical Impressions(s) / ED Diagnoses   Final diagnoses:  Viral URI with cough  Wheezing  Tobacco abuse    ED Discharge Orders         Ordered    Albuterol Sulfate (PROAIR RESPICLICK) 108 (90 Base) MCG/ACT AEPB  Every 4 hours PRN     09/08/18 2119    methylPREDNISolone (MEDROL DOSEPAK) 4 MG TBPK tablet     09/08/18 2119             Arthor Captain, PA-C 09/08/18 2134    Arthor Captain, PA-C 09/08/18 2135    Rolan Bucco, MD 09/09/18 1226

## 2018-09-17 ENCOUNTER — Ambulatory Visit: Payer: Self-pay | Admitting: Family Medicine

## 2018-11-06 DIAGNOSIS — I2699 Other pulmonary embolism without acute cor pulmonale: Secondary | ICD-10-CM

## 2018-11-07 DIAGNOSIS — I2699 Other pulmonary embolism without acute cor pulmonale: Secondary | ICD-10-CM

## 2020-11-02 IMAGING — DX DG FOOT COMPLETE 3+V*L*
3 series · 3 of 3 positions shown · non-contrast
Comparison: None.

CLINICAL DATA: Left foot pain for 1 week, no known injury, initial
encounter

EXAM:
LEFT FOOT - COMPLETE 3+ VIEW

[foot ap]
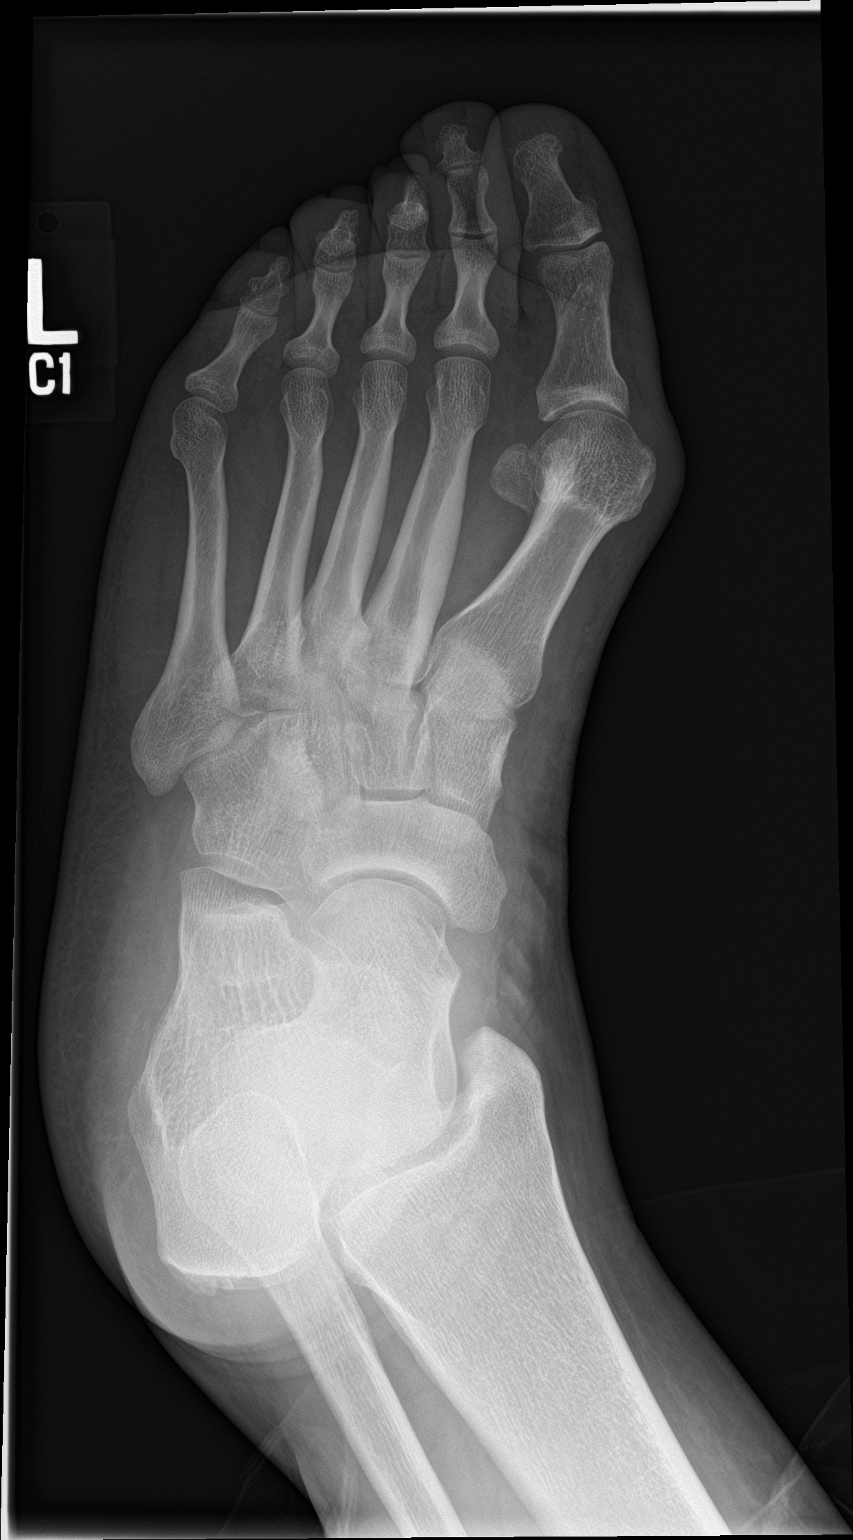

[foot obl]
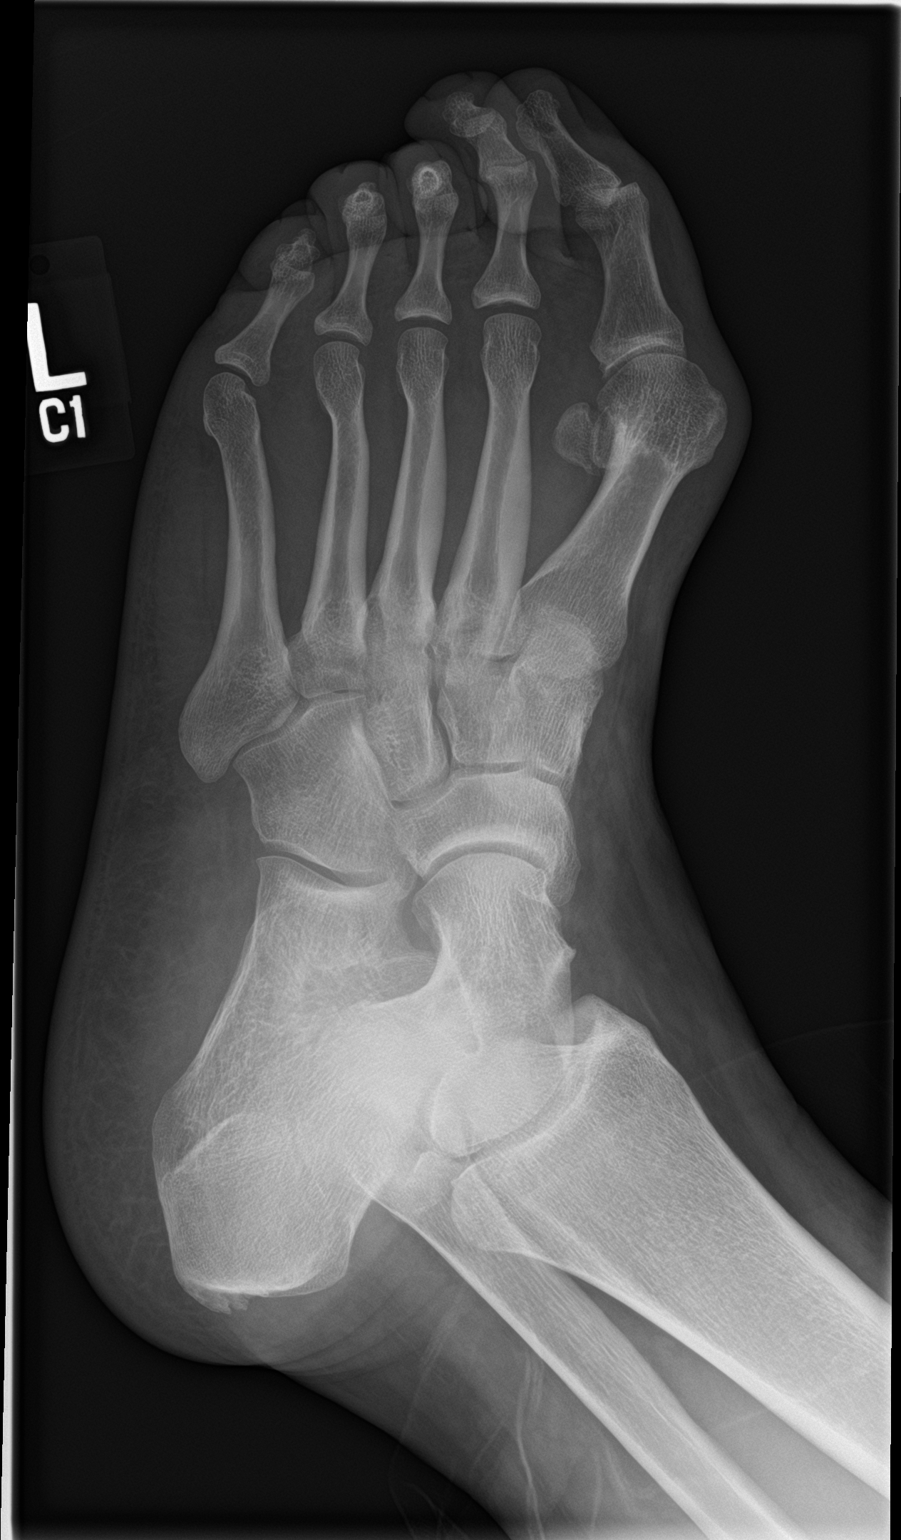

[foot lat]
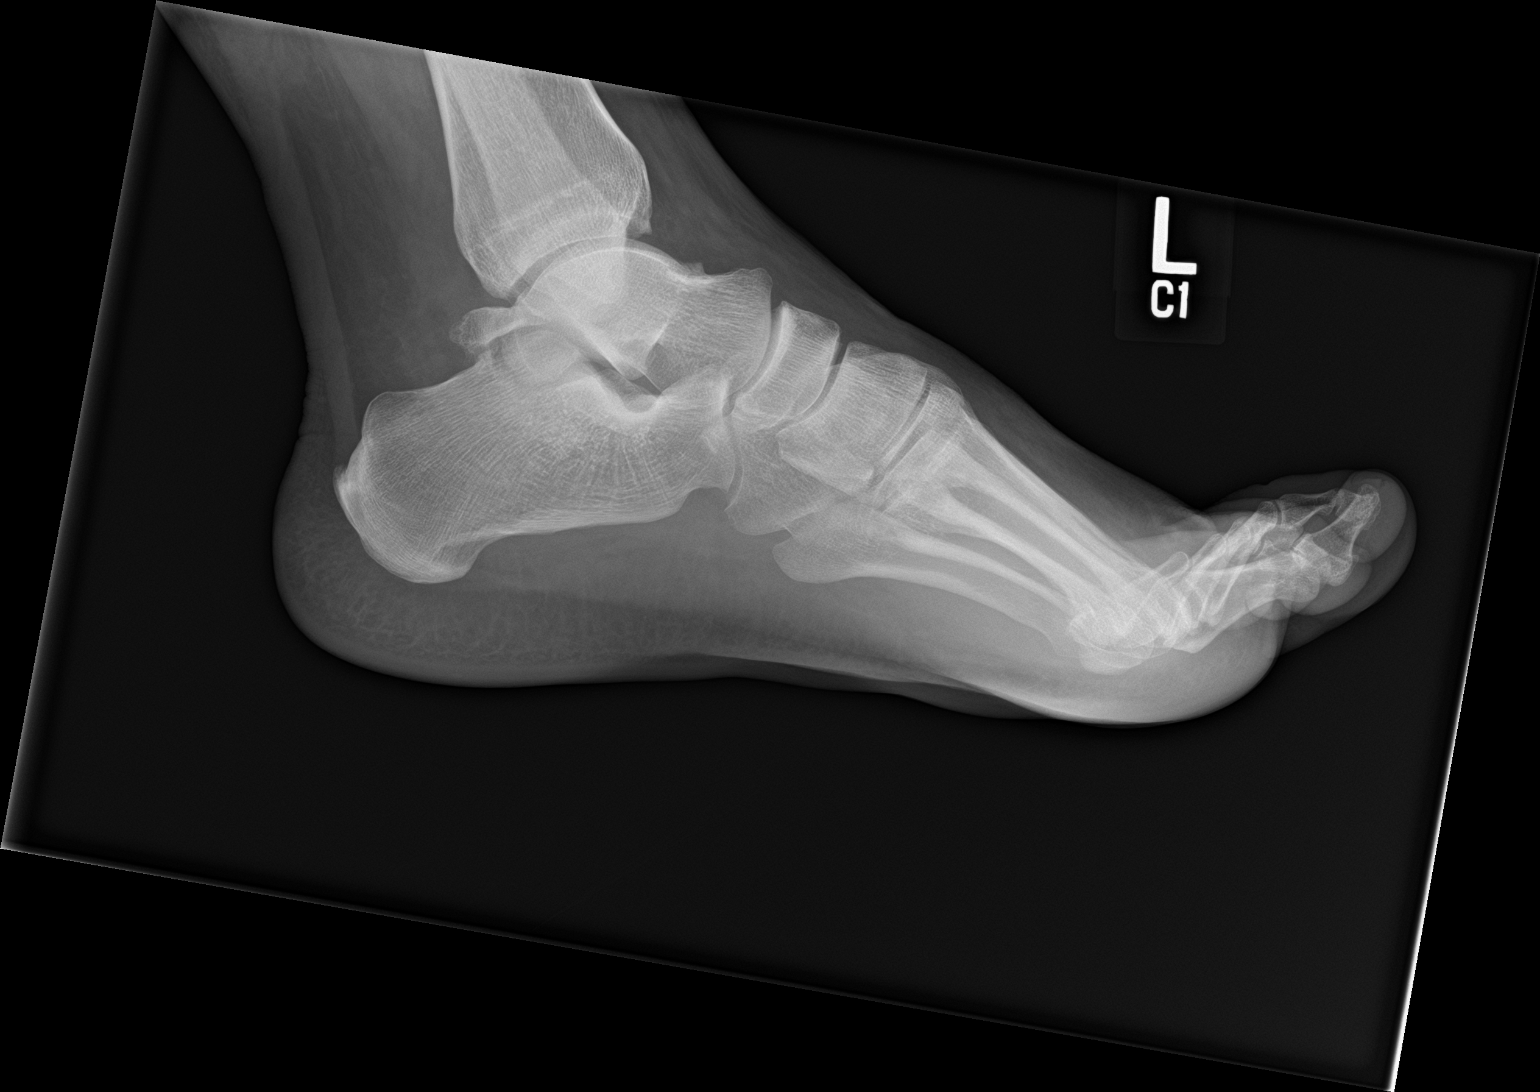

[3 of 3 positions shown; findings below may reference images not displayed]

FINDINGS: Hallux valgus deformity is noted. No acute fracture or dislocation
is seen. No soft tissue abnormality is noted.
IMPRESSION: No acute abnormality noted.
